# Patient Record
Sex: Female | Born: 1940 | Race: White | Hispanic: No | Marital: Married | Smoking: Never smoker
Health system: Southern US, Community
[De-identification: ages and names within clinical notes are randomized; demographics above are authoritative.]

## PROBLEM LIST (undated history)

## (undated) DIAGNOSIS — T8859XA Other complications of anesthesia, initial encounter: Secondary | ICD-10-CM

## (undated) DIAGNOSIS — M1990 Unspecified osteoarthritis, unspecified site: Secondary | ICD-10-CM

## (undated) DIAGNOSIS — E039 Hypothyroidism, unspecified: Secondary | ICD-10-CM

## (undated) DIAGNOSIS — T4145XA Adverse effect of unspecified anesthetic, initial encounter: Secondary | ICD-10-CM

## (undated) DIAGNOSIS — C801 Malignant (primary) neoplasm, unspecified: Secondary | ICD-10-CM

## (undated) DIAGNOSIS — R55 Syncope and collapse: Secondary | ICD-10-CM

## (undated) DIAGNOSIS — E079 Disorder of thyroid, unspecified: Secondary | ICD-10-CM

## (undated) HISTORY — PX: COSMETIC SURGERY: SHX468

## (undated) HISTORY — PX: KNEE SURGERY: SHX244

## (undated) HISTORY — PX: CATARACT EXTRACTION: SUR2

## (undated) HISTORY — PX: ABDOMINAL HYSTERECTOMY: SHX81

## (undated) HISTORY — DX: Unspecified osteoarthritis, unspecified site: M19.90

## (undated) HISTORY — PX: BRACHIOPLASTY: SUR162

## (undated) HISTORY — PX: BREAST SURGERY: SHX581

## (undated) HISTORY — PX: LENS MATERIAL REMOVAL: SHX368

## (undated) HISTORY — PX: EYE SURGERY: SHX253

---

## 2011-08-22 ENCOUNTER — Emergency Department
Admission: EM | Admit: 2011-08-22 | Discharge: 2011-08-22 | Disposition: A | Discharge status: Home / Self Care | Payer: Medicare Other | Source: Home / Self Care | Attending: Emergency Medicine | Admitting: Emergency Medicine

## 2011-08-22 ENCOUNTER — Encounter: Payer: Self-pay | Admitting: *Deleted

## 2011-08-22 DIAGNOSIS — L02219 Cutaneous abscess of trunk, unspecified: Secondary | ICD-10-CM

## 2011-08-22 DIAGNOSIS — L02211 Cutaneous abscess of abdominal wall: Secondary | ICD-10-CM

## 2011-08-22 MED ORDER — DOXYCYCLINE HYCLATE 100 MG PO CAPS: 100.0000 mg | ORAL_CAPSULE | Freq: Two times a day (BID) | ORAL | Status: DC

## 2011-08-22 MED ORDER — SULFAMETHOXAZOLE-TRIMETHOPRIM 800-160 MG PO TABS: 1.0000 | ORAL_TABLET | Freq: Two times a day (BID) | ORAL | Status: AC

## 2011-08-22 NOTE — ED Provider Notes (Signed)
History     CSN: [id number]  Arrival date & time 08/22/11  1035   First MD Initiated Contact with Patient 08/22/11 1126      Chief Complaint  Patient presents with  . Cyst    lower abdomen   HPI: @ATITLE @  @PATIENTLASTNAME @ presents for evaluation of a cutaneous abscess. The lesion is located in the lower mid abdominal wall.. Onset was 1 day ago. Symptoms have gradually worsened. Abscess has associated symptoms of pain. She  does have previous history of cutaneous abscesses. There is not a previous history of MRSA.  She does not have a history of diabetes.  ROS:  Systemic:  No fevers, chills or sweats. Skin:  No rash or other skin lesions.  PE:  General:  Alert, active, in no distress. Skin:  There was a 2 cm, red, hot, tender, fluctulent mass on the lower mid abdomen, as depicted.  No other skin rash or skin lesions.   IMP:  Abscess of the abdominal wall.   Procedure: Incision and Drainage The procedure, risks and complications have been discussed in detail (including, but not limited to airway compromise, infection, bleeding) with the patient, and the patient has given verbal consent to the procedure.  The skin was prepped with Betadine and alcohol. After adequate local anesthesia with 3 cc of 1% lidocaine with epinephrine), I&D with a #11 blade was performed on the abscess as described in physical exam section. Purulent drainage: present.  Blunt dissection was used to break up loculations.     Wound culture was sent. A sterile pressure dressing was applied. The patient tolerated the procedure well. She was given aftercare instructions and started on antibiotics as listed below.    The history is provided by the patient and the spouse.    History reviewed. No pertinent past medical history.  Past Surgical History  Procedure Date  . Lens material removal     Family History  Problem Relation Age of Onset  . Diabetes Mother   . Heart failure Father     History  Substance  Use Topics  . Smoking status: Never Smoker   . Smokeless tobacco: Not on file  . Alcohol Use: Yes    OB History    Grav Para Term Preterm Abortions TAB SAB Ect Mult Living                  Review of Systems  Allergies  Review of patient's allergies indicates no known allergies.  Home Medications   Current Outpatient Rx  Name Route Sig Dispense Refill  . CONJ ESTROG-MEDROXYPROGEST ACE 0.3-1.5 MG PO TABS Oral Take 1 tablet by mouth daily.      . SULFAMETHOXAZOLE-TRIMETHOPRIM 800-160 MG PO TABS Oral Take 1 tablet by mouth 2 (two) times daily. 20 tablet 0    BP 136/72  Pulse 65  Temp(Src) 97.9 F (36.6 C) (Oral)  Resp 16  Ht 5\' 3"  (1.6 m)  Wt 151 lb (68.493 kg)  BMI 26.75 kg/m2  SpO2 98%  Physical Exam  Nursing note and vitals reviewed. Constitutional: She is oriented to person, place, and time. She appears well-developed and well-nourished. No distress.  HENT:  Head: Normocephalic and atraumatic.  Eyes: Conjunctivae and EOM are normal. Pupils are equal, round, and reactive to light. No scleral icterus.  Neck: Normal range of motion.  Cardiovascular: Normal rate.   Pulmonary/Chest: Effort normal.  Abdominal: She exhibits no distension.    Musculoskeletal: Normal range of motion.  Neurological: She is  alert and oriented to person, place, and time.  Skin: Skin is warm.  Psychiatric: She has a normal mood and affect.    ED Course  Procedures (including critical care time)   Labs Reviewed  WOUND CULTURE   Diagnosis: Abscess lower abdominal wall  MDM  See procedure note for details. See detailed Instructions in AVS, which were given to patient. Verbal instructions also given. Questions invited and answered.  Initially, I prescribed doxycycline, but her pharmacy did not have this. Patient requests a different antibiotic, although I explained doxycycline was my first choice. To cover staph and some MRSA, Septra DS prescribed.  Options discussed. Risks,  benefits, alternatives discussed. Patient voiced understanding and agreement.         Lonell Face, MD 08/22/11 [phone]

## 2011-08-22 NOTE — ED Notes (Signed)
Patient developed a cyst on her lower abdomen yesterday. Hx of cyst in same area.

## 2011-08-25 LAB — WOUND CULTURE

## 2011-08-26 ENCOUNTER — Telehealth: Payer: Self-pay | Admitting: Family Medicine

## 2013-05-22 ENCOUNTER — Emergency Department
Admission: EM | Admit: 2013-05-22 | Discharge: 2013-05-22 | Disposition: A | Discharge status: Home / Self Care | Payer: Medicare Other | Source: Home / Self Care | Attending: Family Medicine | Admitting: Family Medicine

## 2013-05-22 ENCOUNTER — Encounter: Payer: Self-pay | Admitting: *Deleted

## 2013-05-22 DIAGNOSIS — R358 Other polyuria: Secondary | ICD-10-CM

## 2013-05-22 HISTORY — DX: Disorder of thyroid, unspecified: E07.9

## 2013-05-22 LAB — POCT URINALYSIS DIP (MANUAL ENTRY)
Glucose, UA: NEGATIVE
Nitrite, UA: NEGATIVE
Urobilinogen, UA: 0.2 (ref 0–1)
pH, UA: 7 (ref 5–8)

## 2013-05-22 MED ORDER — CEPHALEXIN 500 MG PO CAPS: 500.0000 mg | ORAL_CAPSULE | Freq: Three times a day (TID) | ORAL | Status: DC

## 2013-05-22 NOTE — ED Provider Notes (Signed)
CSN: [id number]     Arrival date & time 05/22/13  1708 History   First MD Initiated Contact with Patient 05/22/13 1711     Chief Complaint  Patient presents with  . Urinary Tract Infection    HPI  DYSURIA Onset:  2 days  Description: dysuria, increased urinary frequency  Modifying factors: Has had increased sexual activity with husband s/p operative repair of ED over past 3 months. Has had monthly UTIs.   Symptoms Urgency:  yes Frequency: yes  Hesitancy:  yes Hematuria:  no Flank Pain:  no Fever: no Nausea/Vomiting:  no Missed LMP: no STD exposure: no Discharge: no Irritants: no Rash: no  Red Flags   More than 3 UTI's last 12 months:  This will be 3rd  PMH of  Diabetes or Immunosuppression:  no Renal Disease/Calculi: no Urinary Tract Abnormality:  no Instrumentation or Trauma: no    Past Medical History  Diagnosis Date  . Thyroid disease    Past Surgical History  Procedure Laterality Date  . Lens material removal    . Knee surgery Right    Family History  Problem Relation Age of Onset  . Diabetes Mother   . Heart failure Father    History  Substance Use Topics  . Smoking status: Never Smoker   . Smokeless tobacco: Never Used  . Alcohol Use: Yes   OB History   Grav Para Term Preterm Abortions TAB SAB Ect Mult Living                 Review of Systems  All other systems reviewed and are negative.    Allergies  Review of patient's allergies indicates no known allergies.  Home Medications   Current Outpatient Rx  Name  Route  Sig  Dispense  Refill  . levothyroxine (SYNTHROID, LEVOTHROID) 100 MCG tablet   Oral   Take 100 mcg by mouth daily before breakfast.         . cephALEXin (KEFLEX) 500 MG capsule   Oral   Take 1 capsule (500 mg total) by mouth 3 (three) times daily.   21 capsule   0   . estrogen, conjugated,-medroxyprogesterone (PREMPRO) 0.3-1.5 MG per tablet   Oral   Take 1 tablet by mouth daily.            BP 128/75   Pulse 73  Temp(Src) 98.1 F (36.7 C) (Oral)  Resp 12  Ht 5\' 3"  (1.6 m)  Wt 129 lb 3.2 oz (58.605 kg)  BMI 22.89 kg/m2  SpO2 99% Physical Exam  Constitutional: She appears well-developed and well-nourished.  HENT:  Head: Normocephalic and atraumatic.  Eyes: Conjunctivae are normal. Pupils are equal, round, and reactive to light.  Neck: Normal range of motion. Neck supple.  Cardiovascular: Normal rate and regular rhythm.   Pulmonary/Chest: Effort normal and breath sounds normal.  Abdominal: Soft. Bowel sounds are normal.  No flank pain  No suprapubic tenderness   Musculoskeletal: Normal range of motion.  Neurological: She is alert.  Skin: Skin is warm.    ED Course  Procedures (including critical care time) Labs Review Labs Reviewed  URINE CULTURE  POCT URINALYSIS DIP (MANUAL ENTRY)   Imaging Review No results found.  MDM   1. Polyuria    Will treat with keflex Urine culture Discussed infectious and GU red flags.  Follow up with urology if sxs recur.      The patient and/or caregiver has been counseled thoroughly with regard to treatment plan and/or  medications prescribed including dosage, schedule, interactions, rationale for use, and possible side effects and they verbalize understanding. Diagnoses and expected course of recovery discussed and will return if not improved as expected or if the condition worsens. Patient and/or caregiver verbalized understanding.         Doree Albee, MD 05/22/13 [phone]

## 2013-05-22 NOTE — ED Notes (Signed)
Carol Lambert c/o polyuria and dysuria x 3 days. Denies fever, chills flank pain. No otc meds taken.

## 2013-05-25 ENCOUNTER — Telehealth: Payer: Self-pay | Admitting: Emergency Medicine

## 2015-04-21 DIAGNOSIS — I10 Essential (primary) hypertension: Secondary | ICD-10-CM | POA: Insufficient documentation

## 2017-06-09 ENCOUNTER — Encounter: Payer: Self-pay | Admitting: Gynecologic Oncology

## 2017-06-09 ENCOUNTER — Ambulatory Visit: Payer: Medicare Other | Attending: Gynecologic Oncology | Admitting: Gynecologic Oncology

## 2017-06-09 ENCOUNTER — Ambulatory Visit (HOSPITAL_BASED_OUTPATIENT_CLINIC_OR_DEPARTMENT_OTHER): Payer: Medicare Other

## 2017-06-09 VITALS — BP 127/62 | HR 52 | Temp 97.7°F | Resp 18 | Ht 62.5 in | Wt 135.6 lb

## 2017-06-09 DIAGNOSIS — C541 Malignant neoplasm of endometrium: Secondary | ICD-10-CM | POA: Diagnosis present

## 2017-06-09 DIAGNOSIS — Z803 Family history of malignant neoplasm of breast: Secondary | ICD-10-CM | POA: Diagnosis not present

## 2017-06-09 DIAGNOSIS — Z7982 Long term (current) use of aspirin: Secondary | ICD-10-CM | POA: Diagnosis not present

## 2017-06-09 DIAGNOSIS — H9190 Unspecified hearing loss, unspecified ear: Secondary | ICD-10-CM | POA: Insufficient documentation

## 2017-06-09 DIAGNOSIS — Z79899 Other long term (current) drug therapy: Secondary | ICD-10-CM | POA: Insufficient documentation

## 2017-06-09 DIAGNOSIS — E079 Disorder of thyroid, unspecified: Secondary | ICD-10-CM | POA: Diagnosis not present

## 2017-06-09 DIAGNOSIS — H918X2 Other specified hearing loss, left ear: Secondary | ICD-10-CM

## 2017-06-09 DIAGNOSIS — E039 Hypothyroidism, unspecified: Secondary | ICD-10-CM | POA: Diagnosis not present

## 2017-06-09 LAB — COMPREHENSIVE METABOLIC PANEL
ALBUMIN: 4.1 g/dL (ref 3.5–5.0)
ALK PHOS: 71 U/L (ref 40–150)
ALT: 17 U/L (ref 0–55)
ANION GAP: 9 meq/L (ref 3–11)
AST: 16 U/L (ref 5–34)
BILIRUBIN TOTAL: 1.09 mg/dL (ref 0.20–1.20)
BUN: 22.5 mg/dL (ref 7.0–26.0)
CALCIUM: 10.4 mg/dL (ref 8.4–10.4)
CO2: 30 mEq/L — ABNORMAL HIGH (ref 22–29)
CREATININE: 0.8 mg/dL (ref 0.6–1.1)
Chloride: 103 mEq/L (ref 98–109)
Glucose: 102 mg/dl (ref 70–140)
Potassium: 3.8 mEq/L (ref 3.5–5.1)
Sodium: 142 mEq/L (ref 136–145)
TOTAL PROTEIN: 7.3 g/dL (ref 6.4–8.3)

## 2017-06-09 NOTE — Progress Notes (Signed)
Consult Note: Gyn-Onc  Consult was requested by Dr. Durene Fruits for the evaluation of [name] 76 y.o. female  CC:  Chief Complaint  Patient presents with  . Endometrial cancer [facility])    Assessment/Plan:  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  A detailed discussion was held with the patient and her family with regard to to her endometrial cancer diagnosis. We discussed the standard management options for uterine cancer which includes surgery followed possibly by adjuvant therapy depending on the results of surgery. The options for surgical management include a hysterectomy and removal of the tubes and ovaries possibly with removal of pelvic and para-aortic lymph nodes.If feasible, a minimally invasive approach including a robotic hysterectomy or laparoscopic hysterectomy have benefits including shorter hospital stay, recovery time and better wound healing than with open surgery. The patient has been counseled about these surgical options and the risks of surgery in general including infection, bleeding, damage to surrounding structures (including bowel, bladder, ureters, nerves or vessels), and the postoperative risks of PE/ DVT, and lymphedema. I extensively reviewed the additional risks of robotic hysterectomy including possible need for conversion to open laparotomy.  I discussed positioning during surgery of trendelenberg and risks of minor facial swelling and care we take in preoperative positioning.  After counseling and consideration of her options, she desires to proceed with robotic assisted total hysterectomy with bilateral sapingo-oophorectomy and SLN biopsy.  Due to her nulliparous narrow vagina, she may have some abrasions to the vagina at the time of surgery and I counseled her about this.  She will be seen by anesthesia for preoperative clearance and discussion of postoperative pain management.  She was given the opportunity to ask  questions, which were answered to her satisfaction, and she is agreement with the above mentioned plan of care.  I discussed that due to her high grade cancer we will order a preoperative CT scan with IV contrast.  I discussed that due to her high grade cancer there is a high probability for adjuvant therapy being recommended (vaginal brachytherapy, and possibly chemotherapy).  She takes 81mg  ASA daily, and I have counseled her to stop this prior to surgery.  HPI: Carol Lambert is a 76 year old P0 who is seen in consultation at the request of [name] with high grade endometrial cancer.  The patient has a history of postmenopausal bleeding since September, 2018. She reported this to her OBGYN who performed a TVUS on 05/03/17 which showed a 7.9cm uterus (retroverted) with an 14mm thickened endometrium.  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   She stopped bleeding after the procedure.  She is very healthy with no major medical ailments besides hypothyroidism. She has a 25 year history of postmenopausal prempro usage (stopped this in 2016). She has had no prior abdominal surgeries. Her BMI is 24mg /m2. She takes 81mg  ASA daily. She has a maternal aunt with a history of breast cancer at age 68.  Current Meds:  Outpatient Encounter Prescriptions as of 06/09/2017  Medication Sig  . escitalopram (LEXAPRO) 10 MG tablet Take 10 mg by mouth daily.  Marland Kitchen levothyroxine (SYNTHROID, LEVOTHROID) 100 MCG tablet Take 100 mcg by mouth daily before breakfast.  . [DISCONTINUED] cephALEXin (KEFLEX) 500 MG capsule Take 1 capsule (500 mg total) by mouth 3 (three) times daily.  . [DISCONTINUED] estrogen, conjugated,-medroxyprogesterone (  PREMPRO) 0.3-1.5 MG per tablet Take 1 tablet by mouth daily.     No facility-administered encounter medications on file as of  06/09/2017.     Allergy: No Known Allergies  Social Hx:   Social History   Social History  . Marital status: Married    Spouse name: N/A  . Number of children: N/A  . Years of education: N/A   Occupational History  . Not on file.   Social History Main Topics  . Smoking status: Never Smoker  . Smokeless tobacco: Never Used  . Alcohol use Yes  . Drug use: No  . Sexual activity: Not on file   Other Topics Concern  . Not on file   Social History Narrative  . No narrative on file    Past Surgical Hx:  Past Surgical History:  Procedure Laterality Date  . KNEE SURGERY Right   . LENS MATERIAL REMOVAL      Past Medical Hx:  Past Medical History:  Diagnosis Date  . Thyroid disease     Past Gynecological History:  G0 No LMP recorded. Patient is postmenopausal.  Family Hx:  Family History  Problem Relation Age of Onset  . Diabetes Mother   . Heart failure Father   . Breast cancer Maternal Aunt   . Cancer Maternal Aunt     Review of Systems:  Constitutional  Feels well,    ENT Normal appearing ears and nares bilaterally Skin/Breast  No rash, sores, jaundice, itching, dryness Cardiovascular  No chest pain, shortness of breath, or edema  Pulmonary  No cough or wheeze.  Gastro Intestinal  No nausea, vomitting, or diarrhoea. No bright red blood per rectum, no abdominal pain, change in bowel movement, or constipation.  Genito Urinary  No frequency, urgency, dysuria, + postmenopausal bleeding Musculo Skeletal  No myalgia, arthralgia, joint swelling or pain  Neurologic  No weakness, numbness, change in gait,  Psychology  No depression, anxiety, insomnia.   Vitals:  Blood pressure 127/62, pulse (!) 52, temperature 97.7 F (36.5 C), temperature source Oral, resp. rate 18, height 5' 2.5" (1.588 m), weight 135 lb 9.6 oz (61.5 kg), SpO2 100 %.  Physical Exam: WD in NAD Neck  Supple NROM, without any enlargements.  Lymph Node Survey No cervical  supraclavicular or inguinal adenopathy Cardiovascular  Pulse normal rate, regularity and rhythm. S1 and S2 normal.  Lungs  Clear to auscultation bilateraly, without wheezes/crackles/rhonchi. Good air movement.  Skin  No rash/lesions/breakdown  Psychiatry  Alert and oriented to person, place, and time  Abdomen  Normoactive bowel sounds, abdomen soft, non-tender and thin without evidence of hernia.  Back No CVA tenderness Genito Urinary  Vulva/vagina: Normal external female genitalia.   No lesions. No discharge or bleeding.  Bladder/urethra:  No lesions or masses, well supported bladder  Vagina: normal, narrow  Cervix: Normal appearing, no lesions.  Uterus: retroverted, Small, mobile, no parametrial involvement or nodularity.  Adnexa: no palpable masses. Rectal  deferred Extremities  No bilateral cyanosis, clubbing or edema.   Donaciano Eva, MD  06/09/2017, 9:29 AM

## 2017-06-09 NOTE — Patient Instructions (Signed)
Preparing for your Surgery  Plan for surgery on July 18, 2017 with Dr. Everitt Amber at [place] will be scheduled for a robotic assisted total hysterectomy, bilateral salpingo-oophorectomy, sentinel lymph node biopsy.  Pre-operative Testing -You will receive a phone call from presurgical testing at [facility] to arrange for a pre-operative testing appointment before your surgery.  This appointment normally occurs one to two weeks before your scheduled surgery.   -Bring your insurance card, copy of an advanced directive if applicable, medication list  -At that visit, you will be asked to sign a consent for a possible blood transfusion in case a transfusion becomes necessary during surgery.  The need for a blood transfusion is rare but having consent is a necessary part of your care.     -You should not be taking blood thinners or aspirin at least ten days prior to surgery unless instructed by your surgeon.  Day Before Surgery at [facility] will be asked to take in a light diet the day before surgery.  Avoid carbonated beverages.  You will be advised to have nothing to eat or drink after midnight the evening before.    Eat a light diet the day before surgery.  Examples including soups, broths, toast, yogurt, mashed potatoes.  Things to avoid include carbonated beverages (fizzy beverages), raw fruits and raw vegetables, or beans.   If your bowels are filled with gas, your surgeon will have difficulty visualizing your pelvic organs which increases your surgical risks.  Your role in recovery Your role is to become active as soon as directed by your doctor, while still giving yourself time to heal.  Rest when you feel tired. You will be asked to do the following in order to speed your recovery:  - Cough and breathe deeply. This helps toclear and expand your lungs and can prevent pneumonia. You may be given a spirometer to practice deep breathing. A staff  member will show you how to use the spirometer. - Do mild physical activity. Walking or moving your legs help your circulation and body functions return to normal. A staff member will help you when you try to walk and will provide you with simple exercises. Do not try to get up or walk alone the first time. - Actively manage your pain. Managing your pain lets you move in comfort. We will ask you to rate your pain on a scale of zero to 10. It is your responsibility to tell your doctor or nurse where and how much you hurt so your pain can be treated.  Special Considerations -If you are diabetic, you may be placed on insulin after surgery to have closer control over your blood sugars to promote healing and recovery.  This does not mean that you will be discharged on insulin.  If applicable, your oral antidiabetics will be resumed when you are tolerating a solid diet.  -Your final pathology results from surgery should be available by the Friday after surgery and the results will be relayed to you when available.  -Dr. Lahoma Crocker is the Surgeon that assists your GYN Oncologist with surgery.  The next day after your surgery you will either see your GYN Oncologist or Dr. Lahoma Crocker.   Blood Transfusion Information WHAT IS A BLOOD TRANSFUSION? A transfusion is the replacement of blood or some of its parts. Blood is made up of multiple cells which provide different functions.  Red blood cells carry oxygen and are used for blood  loss replacement.  White blood cells fight against infection.  Platelets control bleeding.  Plasma helps clot blood.  Other blood products are available for specialized needs, such as hemophilia or other clotting disorders. BEFORE THE TRANSFUSION  Who gives blood for transfusions?   You may be able to donate blood to be used at a later date on yourself (autologous donation).  Relatives can be asked to donate blood. This is generally not any safer than if  you have received blood from a stranger. The same precautions are taken to ensure safety when a relative's blood is donated.  Healthy volunteers who are fully evaluated to make sure their blood is safe. This is blood bank blood. Transfusion therapy is the safest it has ever been in the practice of medicine. Before blood is taken from a donor, a complete history is taken to make sure that person has no history of diseases nor engages in risky social behavior (examples are intravenous drug use or sexual activity with multiple partners). The donor's travel history is screened to minimize risk of transmitting infections, such as malaria. The donated blood is tested for signs of infectious diseases, such as HIV and hepatitis. The blood is then tested to be sure it is compatible with you in order to minimize the chance of a transfusion reaction. If you or a relative donates blood, this is often done in anticipation of surgery and is not appropriate for emergency situations. It takes many days to process the donated blood. RISKS AND COMPLICATIONS Although transfusion therapy is very safe and saves many lives, the main dangers of transfusion include:   Getting an infectious disease.  Developing a transfusion reaction. This is an allergic reaction to something in the blood you were given. Every precaution is taken to prevent this. The decision to have a blood transfusion has been considered carefully by your caregiver before blood is given. Blood is not given unless the benefits outweigh the risks.

## 2017-06-13 ENCOUNTER — Telehealth: Payer: Self-pay | Admitting: *Deleted

## 2017-06-13 NOTE — Telephone Encounter (Signed)
Scheduled post op appt and called the patient with the appt.

## 2017-06-14 ENCOUNTER — Encounter (HOSPITAL_COMMUNITY): Payer: Self-pay

## 2017-06-14 ENCOUNTER — Ambulatory Visit (HOSPITAL_COMMUNITY)
Admission: RE | Admit: 2017-06-14 | Discharge: 2017-06-14 | Disposition: A | Discharge status: Home / Self Care | Payer: Medicare Other | Source: Ambulatory Visit | Attending: Gynecologic Oncology | Admitting: Gynecologic Oncology

## 2017-06-14 DIAGNOSIS — I700 Atherosclerosis of aorta: Secondary | ICD-10-CM | POA: Diagnosis not present

## 2017-06-14 DIAGNOSIS — K8020 Calculus of gallbladder without cholecystitis without obstruction: Secondary | ICD-10-CM | POA: Insufficient documentation

## 2017-06-14 DIAGNOSIS — C541 Malignant neoplasm of endometrium: Secondary | ICD-10-CM | POA: Diagnosis not present

## 2017-06-14 MED ORDER — IOPAMIDOL (ISOVUE-300) INJECTION 61%
INTRAVENOUS | Status: AC
  Administered 2017-06-14: 100 mL via INTRAVENOUS
  Filled 2017-06-14: qty 100

## 2017-06-14 MED ORDER — IOPAMIDOL (ISOVUE-300) INJECTION 61%
100.0000 mL | Freq: Once | INTRAVENOUS | Status: AC | PRN
  Administered 2017-06-14: 100 mL via INTRAVENOUS

## 2017-06-15 ENCOUNTER — Telehealth: Payer: Self-pay | Admitting: Gynecologic Oncology

## 2017-06-15 NOTE — Telephone Encounter (Signed)
Patient informed of CT scan results.  No concerns voiced.  Advised to call for any needs. 

## 2017-06-20 ENCOUNTER — Telehealth: Payer: Self-pay

## 2017-06-20 NOTE — Telephone Encounter (Signed)
Carol Lambert called to see if she could use Sudafed for nasal drainage as she is scheduled for surgery on 07-18-17. Told her that she can used it up until 07-14-17 per Melissa Cross,NP.  Pt verbalized understanding.

## 2017-06-29 ENCOUNTER — Telehealth: Payer: Self-pay | Admitting: Gynecologic Oncology

## 2017-06-29 NOTE — Telephone Encounter (Signed)
Patient called stating she would like to stay overnight after surgery instead of being discharged same day.  Spoke with Surgery scheduling and left a message for Amedeo Gory, prior auth specialist.

## 2017-06-30 ENCOUNTER — Telehealth: Payer: Self-pay | Admitting: Gynecologic Oncology

## 2017-06-30 NOTE — Telephone Encounter (Signed)
Returned call to patient asking her to please call the office

## 2017-07-03 ENCOUNTER — Telehealth: Payer: Self-pay | Admitting: Gynecologic Oncology

## 2017-07-03 NOTE — Telephone Encounter (Signed)
Returned call to patient.  She was asking about her CT scan and about random intermittent twinges she has.  All questions answered.  Advised to call for any needs.

## 2017-07-05 ENCOUNTER — Telehealth: Payer: Self-pay | Admitting: Gynecologic Oncology

## 2017-07-05 NOTE — Telephone Encounter (Signed)
Called to inform patient that there had been a cancellation for next week with Dr. Denman George.  She would like to move her surgery up.  Left message for Darlena with prior auth, spoke with pre-surg as well.  No concerns voiced.

## 2017-07-07 NOTE — Patient Instructions (Addendum)
Carol Lambert  07/07/2017   Your procedure is scheduled on: 07-11-17  Report to [facility] Main  Entrance Take [place]  elevators to 3rd floor to  [place] at 1130AM.   Call this number if you have problems the morning of surgery [phone]    Remember: ONLY 1 PERSON MAY GO WITH YOU TO SHORT STAY TO GET  READY MORNING OF [place].     Eat a light diet the day before surgery.  Examples including soups, broths, toast, yogurt, mashed potatoes.  Things to avoid include carbonated beverages (fizzy beverages), raw fruits and raw vegetables, or beans. If your bowels are filled with gas, your surgeon will have difficulty visualizing your pelvic organs which increases your surgical risks   Do not eat food or drink liquids :After Midnight.     Take these medicines the morning of surgery with A SIP OF WATER: levothyroxine(synthroid), eye drops                                 You may not have any metal on your body including hair pins and              piercings  Do not wear jewelry, make-up, lotions, powders or perfumes, deodorant             Do not wear nail polish.  Do not shave  48 hours prior to surgery.     Do not bring valuables to the hospital. [place].  Contacts, dentures or bridgework may not be worn into surgery.  Leave suitcase in the car. After surgery it may be brought to your room.                 Please read over the following fact sheets you were given: _____________________________________________________________________          .[place] - Preparing for Surgery Before surgery, you can play an important role.  Because skin is not sterile, your skin needs to be as free of germs as possible.  You can reduce the number of germs on your skin by washing with CHG (chlorahexidine gluconate) soap before surgery.  CHG is an antiseptic cleaner which kills germs and bonds with the skin to  continue killing germs even after washing. Please DO NOT use if you have an allergy to CHG or antibacterial soaps.  If your skin becomes reddened/irritated stop using the CHG and inform your nurse when you arrive at Short Stay. Do not shave (including legs and underarms) for at least 48 hours prior to the first CHG shower.  You may shave your face/neck. Please follow these instructions carefully:  1.  Shower with CHG Soap the night before surgery and the  morning of Surgery.  2.  If you choose to wash your hair, wash your hair first as usual with your  normal  shampoo.  3.  After you shampoo, rinse your hair and body thoroughly to remove the  shampoo.                           4.  Use CHG as you would any other liquid soap.  You can apply chg directly  to the  skin and wash                       Gently with a scrungie or clean washcloth.  5.  Apply the CHG Soap to your body ONLY FROM THE NECK DOWN.   Do not use on face/ open                           Wound or open sores. Avoid contact with eyes, ears mouth and genitals (private parts).                       Wash face,  Genitals (private parts) with your normal soap.             6.  Wash thoroughly, paying special attention to the area where your surgery  will be performed.  7.  Thoroughly rinse your body with warm water from the neck down.  8.  DO NOT shower/wash with your normal soap after using and rinsing off  the CHG Soap.                9.  Pat yourself dry with a clean towel.            10.  Wear clean pajamas.            11.  Place clean sheets on your bed the night of your first shower and do not  sleep with pets. Day of Surgery : Do not apply any lotions/deodorants the morning of surgery.  Please wear clean clothes to the hospital/surgery center.  FAILURE TO FOLLOW THESE INSTRUCTIONS MAY RESULT IN THE CANCELLATION OF YOUR SURGERY PATIENT SIGNATURE_________________________________  NURSE  SIGNATURE__________________________________  ________________________________________________________________________   Carol Lambert  An incentive spirometer is a tool that can help keep your lungs clear and active. This tool measures how well you are filling your lungs with each breath. Taking long deep breaths may help reverse or decrease the chance of developing breathing (pulmonary) problems (especially infection) following:  A long period of time when you are unable to move or be active. BEFORE THE PROCEDURE   If the spirometer includes an indicator to show your best effort, your nurse or respiratory therapist will set it to a desired goal.  If possible, sit up straight or lean slightly forward. Try not to slouch.  Hold the incentive spirometer in an upright position. INSTRUCTIONS FOR USE  1. Sit on the edge of your bed if possible, or sit up as far as you can in bed or on a chair. 2. Hold the incentive spirometer in an upright position. 3. Breathe out normally. 4. Place the mouthpiece in your mouth and seal your lips tightly around it. 5. Breathe in slowly and as deeply as possible, raising the piston or the ball toward the top of the column. 6. Hold your breath for 3-5 seconds or for as long as possible. Allow the piston or ball to fall to the bottom of the column. 7. Remove the mouthpiece from your mouth and breathe out normally. 8. Rest for a few seconds and repeat Steps 1 through 7 at least 10 times every 1-2 hours when you are awake. Take your time and take a few normal breaths between deep breaths. 9. The spirometer may include an indicator to show your best effort. Use the indicator as a goal to work toward during each repetition. 10. After each set of  10 deep breaths, practice coughing to be sure your lungs are clear. If you have an incision (the cut made at the time of surgery), support your incision when coughing by placing a pillow or rolled up towels firmly  against it. Once you are able to get out of bed, walk around indoors and cough well. You may stop using the incentive spirometer when instructed by your caregiver.  RISKS AND COMPLICATIONS  Take your time so you do not get dizzy or light-headed.  If you are in pain, you may need to take or ask for pain medication before doing incentive spirometry. It is harder to take a deep breath if you are having pain. AFTER USE  Rest and breathe slowly and easily.  It can be helpful to keep track of a log of your progress. Your caregiver can provide you with a simple table to help with this. If you are using the spirometer at home, follow these instructions: [place] IF:   You are having difficultly using the spirometer.  You have trouble using the spirometer as often as instructed.  Your pain medication is not giving enough relief while using the spirometer.  You develop fever of 100.5 F (38.1 C) or higher. SEEK IMMEDIATE MEDICAL CARE IF:   You cough up bloody sputum that had not been present before.  You develop fever of 102 F (38.9 C) or greater.  You develop worsening pain at or near the incision site. MAKE SURE YOU:   Understand these instructions.  Will watch your condition.  Will get help right away if you are not doing well or get worse. Document Released: 12/26/2006 Document Revised: 11/07/2011 Document Reviewed: 02/26/2007 ExitCare Patient Information 2014 ExitCare, [place].   ________________________________________________________________________  WHAT IS A BLOOD TRANSFUSION? Blood Transfusion Information  A transfusion is the replacement of blood or some of its parts. Blood is made up of multiple cells which provide different functions.  Red blood cells carry oxygen and are used for blood loss replacement.  White blood cells fight against infection.  Platelets control bleeding.  Plasma helps clot blood.  Other blood products are available for  specialized needs, such as hemophilia or other clotting disorders. BEFORE THE TRANSFUSION  Who gives blood for transfusions?   Healthy volunteers who are fully evaluated to make sure their blood is safe. This is blood bank blood. Transfusion therapy is the safest it has ever been in the practice of medicine. Before blood is taken from a donor, a complete history is taken to make sure that person has no history of diseases nor engages in risky social behavior (examples are intravenous drug use or sexual activity with multiple partners). The donor's travel history is screened to minimize risk of transmitting infections, such as malaria. The donated blood is tested for signs of infectious diseases, such as HIV and hepatitis. The blood is then tested to be sure it is compatible with you in order to minimize the chance of a transfusion reaction. If you or a relative donates blood, this is often done in anticipation of surgery and is not appropriate for emergency situations. It takes many days to process the donated blood. RISKS AND COMPLICATIONS Although transfusion therapy is very safe and saves many lives, the main dangers of transfusion include:   Getting an infectious disease.  Developing a transfusion reaction. This is an allergic reaction to something in the blood you were given. Every precaution is taken to prevent this. The decision to have a blood  transfusion has been considered carefully by your caregiver before blood is given. Blood is not given unless the benefits outweigh the risks. AFTER THE TRANSFUSION  Right after receiving a blood transfusion, you will usually feel much better and more energetic. This is especially true if your red blood cells have gotten low (anemic). The transfusion raises the level of the red blood cells which carry oxygen, and this usually causes an energy increase.  The nurse administering the transfusion will monitor you carefully for complications. HOME CARE  INSTRUCTIONS  No special instructions are needed after a transfusion. You may find your energy is better. Speak with your caregiver about any limitations on activity for underlying diseases you may have. SEEK MEDICAL CARE IF:   Your condition is not improving after your transfusion.  You develop redness or irritation at the intravenous (IV) site. SEEK IMMEDIATE MEDICAL CARE IF:  Any of the following symptoms occur over the next 12 hours:  Shaking chills.  You have a temperature by mouth above 102 F (38.9 C), not controlled by medicine.  Chest, back, or muscle pain.  People around you feel you are not acting correctly or are confused.  Shortness of breath or difficulty breathing.  Dizziness and fainting.  You get a rash or develop hives.  You have a decrease in urine output.  Your urine turns a dark color or changes to pink, red, or brown. Any of the following symptoms occur over the next 10 days:  You have a temperature by mouth above 102 F (38.9 C), not controlled by medicine.  Shortness of breath.  Weakness after normal activity.  The white part of the eye turns yellow (jaundice).  You have a decrease in the amount of urine or are urinating less often.  Your urine turns a dark color or changes to pink, red, or brown. Document Released: 08/12/2000 Document Revised: 11/07/2011 Document Reviewed: 03/31/2008 [facility] Patient Information 2014 ExitCare, [place].  _______________________________________________________________________   Do not eat foods after midnight. You may have clear liquids from midnight until 8am. Nothing by mouth after 8am!!   CLEAR LIQUID DIET   Foods Allowed                                                                     Foods Excluded  Coffee and tea, regular and decaf                             liquids that you cannot  Plain Jell-O in any flavor                                             see through such as: Fruit ices (not with  fruit pulp)                                     milk, soups, orange juice  Iced Popsicles  All solid food Carbonated beverages, regular and diet                                    Cranberry, grape and apple juices Sports drinks like Gatorade Lightly seasoned clear broth or consume(fat free) Sugar, honey syrup  Sample Menu Breakfast                                Lunch                                     Supper Cranberry juice                    Beef broth                            Chicken broth Jell-O                                     Grape juice                           Apple juice Coffee or tea                        Jell-O                                      Popsicle                                                Coffee or tea                        Coffee or tea  _____________________________________________________________________

## 2017-07-10 ENCOUNTER — Encounter (HOSPITAL_COMMUNITY)
Admission: RE | Admit: 2017-07-10 | Discharge: 2017-07-10 | Disposition: A | Discharge status: Home / Self Care | Payer: Medicare Other | Source: Ambulatory Visit | Attending: Gynecologic Oncology | Admitting: Gynecologic Oncology

## 2017-07-10 ENCOUNTER — Encounter (HOSPITAL_COMMUNITY): Payer: Self-pay

## 2017-07-10 ENCOUNTER — Encounter (INDEPENDENT_AMBULATORY_CARE_PROVIDER_SITE_OTHER): Payer: Self-pay

## 2017-07-10 ENCOUNTER — Other Ambulatory Visit: Payer: Self-pay

## 2017-07-10 DIAGNOSIS — N800 Endometriosis of uterus: Secondary | ICD-10-CM | POA: Diagnosis not present

## 2017-07-10 DIAGNOSIS — C541 Malignant neoplasm of endometrium: Secondary | ICD-10-CM | POA: Diagnosis present

## 2017-07-10 DIAGNOSIS — E039 Hypothyroidism, unspecified: Secondary | ICD-10-CM | POA: Diagnosis not present

## 2017-07-10 DIAGNOSIS — I10 Essential (primary) hypertension: Secondary | ICD-10-CM | POA: Diagnosis not present

## 2017-07-10 DIAGNOSIS — N840 Polyp of corpus uteri: Secondary | ICD-10-CM | POA: Diagnosis not present

## 2017-07-10 DIAGNOSIS — Z7982 Long term (current) use of aspirin: Secondary | ICD-10-CM | POA: Diagnosis not present

## 2017-07-10 DIAGNOSIS — Z79899 Other long term (current) drug therapy: Secondary | ICD-10-CM | POA: Diagnosis not present

## 2017-07-10 HISTORY — DX: Syncope and collapse: R55

## 2017-07-10 HISTORY — DX: Hypothyroidism, unspecified: E03.9

## 2017-07-10 HISTORY — DX: Other complications of anesthesia, initial encounter: T88.59XA

## 2017-07-10 HISTORY — DX: Malignant (primary) neoplasm, unspecified: C80.1

## 2017-07-10 HISTORY — DX: Adverse effect of unspecified anesthetic, initial encounter: T41.45XA

## 2017-07-10 LAB — CBC
HCT: 37.9 % (ref 36.0–46.0)
HEMOGLOBIN: 12.9 g/dL (ref 12.0–15.0)
MCH: 31.5 pg (ref 26.0–34.0)
MCHC: 34 g/dL (ref 30.0–36.0)
MCV: 92.4 fL (ref 78.0–100.0)
Platelets: 278 10*3/uL (ref 150–400)
RBC: 4.1 MIL/uL (ref 3.87–5.11)
RDW: 12.5 % (ref 11.5–15.5)
WBC: 7 10*3/uL (ref 4.0–10.5)

## 2017-07-10 LAB — URINALYSIS, ROUTINE W REFLEX MICROSCOPIC
BACTERIA UA: NONE SEEN
BILIRUBIN URINE: NEGATIVE
GLUCOSE, UA: NEGATIVE mg/dL
HGB URINE DIPSTICK: NEGATIVE
KETONES UR: NEGATIVE mg/dL
NITRITE: NEGATIVE
PROTEIN: NEGATIVE mg/dL
Specific Gravity, Urine: 1.013 (ref 1.005–1.030)
pH: 6 (ref 5.0–8.0)

## 2017-07-10 LAB — COMPREHENSIVE METABOLIC PANEL
ALK PHOS: 67 U/L (ref 38–126)
ALT: 26 U/L (ref 14–54)
AST: 23 U/L (ref 15–41)
Albumin: 4 g/dL (ref 3.5–5.0)
Anion gap: 7 (ref 5–15)
BUN: 21 mg/dL — AB (ref 6–20)
CALCIUM: 9.5 mg/dL (ref 8.9–10.3)
CO2: 30 mmol/L (ref 22–32)
CREATININE: 0.72 mg/dL (ref 0.44–1.00)
Chloride: 102 mmol/L (ref 101–111)
Glucose, Bld: 101 mg/dL — ABNORMAL HIGH (ref 65–99)
Potassium: 3.5 mmol/L (ref 3.5–5.1)
Sodium: 139 mmol/L (ref 135–145)
Total Bilirubin: 0.6 mg/dL (ref 0.3–1.2)
Total Protein: 6.7 g/dL (ref 6.5–8.1)

## 2017-07-10 LAB — ABO/RH: ABO/RH(D): A POS

## 2017-07-11 ENCOUNTER — Ambulatory Visit (HOSPITAL_COMMUNITY): Payer: Medicare Other | Admitting: Certified Registered Nurse Anesthetist

## 2017-07-11 ENCOUNTER — Encounter (HOSPITAL_COMMUNITY)
Admission: RE | Disposition: A | Discharge status: Home / Self Care | Payer: Self-pay | Source: Ambulatory Visit | Attending: Gynecologic Oncology

## 2017-07-11 ENCOUNTER — Ambulatory Visit (HOSPITAL_COMMUNITY)
Admission: RE | Admit: 2017-07-11 | Discharge: 2017-07-12 | Disposition: A | Discharge status: Home / Self Care | Payer: Medicare Other | Source: Ambulatory Visit | Attending: Gynecologic Oncology | Admitting: Gynecologic Oncology

## 2017-07-11 ENCOUNTER — Encounter (HOSPITAL_COMMUNITY): Payer: Self-pay | Admitting: Emergency Medicine

## 2017-07-11 DIAGNOSIS — I10 Essential (primary) hypertension: Secondary | ICD-10-CM | POA: Insufficient documentation

## 2017-07-11 DIAGNOSIS — C541 Malignant neoplasm of endometrium: Secondary | ICD-10-CM | POA: Diagnosis not present

## 2017-07-11 DIAGNOSIS — N840 Polyp of corpus uteri: Secondary | ICD-10-CM | POA: Diagnosis not present

## 2017-07-11 DIAGNOSIS — N800 Endometriosis of uterus: Secondary | ICD-10-CM | POA: Insufficient documentation

## 2017-07-11 DIAGNOSIS — Z7982 Long term (current) use of aspirin: Secondary | ICD-10-CM | POA: Insufficient documentation

## 2017-07-11 DIAGNOSIS — Z79899 Other long term (current) drug therapy: Secondary | ICD-10-CM | POA: Insufficient documentation

## 2017-07-11 DIAGNOSIS — E039 Hypothyroidism, unspecified: Secondary | ICD-10-CM | POA: Insufficient documentation

## 2017-07-11 HISTORY — PX: ROBOTIC ASSISTED TOTAL HYSTERECTOMY WITH BILATERAL SALPINGO OOPHERECTOMY: SHX6086

## 2017-07-11 LAB — TYPE AND SCREEN
ABO/RH(D): A POS
ANTIBODY SCREEN: NEGATIVE

## 2017-07-11 SURGERY — HYSTERECTOMY, TOTAL, ROBOT-ASSISTED, LAPAROSCOPIC, WITH BILATERAL SALPINGO-OOPHORECTOMY
Anesthesia: General | Laterality: Bilateral

## 2017-07-11 MED ORDER — ENOXAPARIN SODIUM 40 MG/0.4ML ~~LOC~~ SOLN
40.0000 mg | SUBCUTANEOUS | Status: DC
  Administered 2017-07-12: 40 mg via SUBCUTANEOUS
  Filled 2017-07-11: qty 0.4

## 2017-07-11 MED ORDER — HYDROMORPHONE HCL 1 MG/ML IJ SOLN: 0.2000 mg | INTRAMUSCULAR | Status: DC | PRN

## 2017-07-11 MED ORDER — FENTANYL CITRATE (PF) 100 MCG/2ML IJ SOLN
INTRAMUSCULAR | Status: DC | PRN
  Administered 2017-07-11 (×5): 50 ug via INTRAVENOUS

## 2017-07-11 MED ORDER — CEFAZOLIN SODIUM-DEXTROSE 2-4 GM/100ML-% IV SOLN
2.0000 g | INTRAVENOUS | Status: AC
  Administered 2017-07-11: 2 g via INTRAVENOUS
  Filled 2017-07-11: qty 100

## 2017-07-11 MED ORDER — LACTATED RINGERS IV SOLN
INTRAVENOUS | Status: DC
  Administered 2017-07-11 (×3): via INTRAVENOUS

## 2017-07-11 MED ORDER — ROCURONIUM BROMIDE 100 MG/10ML IV SOLN
INTRAVENOUS | Status: DC | PRN
Start: 2017-07-11 — End: 2017-07-11
  Administered 2017-07-11: 40 mg via INTRAVENOUS
  Administered 2017-07-11 (×2): 10 mg via INTRAVENOUS
  Administered 2017-07-11: 20 mg via INTRAVENOUS

## 2017-07-11 MED ORDER — MIDAZOLAM HCL 2 MG/2ML IJ SOLN: 0.5000 mg | Freq: Once | INTRAMUSCULAR | Status: DC | PRN

## 2017-07-11 MED ORDER — LIDOCAINE 2% (20 MG/ML) 5 ML SYRINGE
INTRAMUSCULAR | Status: AC
  Filled 2017-07-11: qty 5

## 2017-07-11 MED ORDER — ONDANSETRON HCL 4 MG/2ML IJ SOLN
INTRAMUSCULAR | Status: AC
  Filled 2017-07-11: qty 2

## 2017-07-11 MED ORDER — TRAMADOL HCL 50 MG PO TABS: 100.0000 mg | ORAL_TABLET | Freq: Two times a day (BID) | ORAL | Status: DC | PRN

## 2017-07-11 MED ORDER — PROMETHAZINE HCL 25 MG/ML IJ SOLN: 6.2500 mg | INTRAMUSCULAR | Status: DC | PRN

## 2017-07-11 MED ORDER — DEXAMETHASONE SODIUM PHOSPHATE 10 MG/ML IJ SOLN
INTRAMUSCULAR | Status: DC | PRN
  Administered 2017-07-11: 10 mg via INTRAVENOUS

## 2017-07-11 MED ORDER — HYDROMORPHONE HCL 1 MG/ML IJ SOLN
INTRAMUSCULAR | Status: AC
  Filled 2017-07-11: qty 1

## 2017-07-11 MED ORDER — CELECOXIB 200 MG PO CAPS
400.0000 mg | ORAL_CAPSULE | ORAL | Status: AC
  Administered 2017-07-11: 400 mg via ORAL
  Filled 2017-07-11: qty 2

## 2017-07-11 MED ORDER — ONDANSETRON HCL 4 MG PO TABS: 4.0000 mg | ORAL_TABLET | Freq: Four times a day (QID) | ORAL | Status: DC | PRN

## 2017-07-11 MED ORDER — HYDROMORPHONE HCL 1 MG/ML IJ SOLN
0.2500 mg | INTRAMUSCULAR | Status: DC | PRN
  Administered 2017-07-11 (×2): 0.5 mg via INTRAVENOUS

## 2017-07-11 MED ORDER — SCOPOLAMINE 1 MG/3DAYS TD PT72
1.0000 | MEDICATED_PATCH | TRANSDERMAL | Status: DC
  Administered 2017-07-11: 1.5 mg via TRANSDERMAL
  Filled 2017-07-11: qty 1

## 2017-07-11 MED ORDER — ROCURONIUM BROMIDE 50 MG/5ML IV SOSY
PREFILLED_SYRINGE | INTRAVENOUS | Status: AC
  Filled 2017-07-11: qty 10

## 2017-07-11 MED ORDER — INDOCYANINE GREEN 25 MG IV SOLR
INTRAVENOUS | Status: DC | PRN
  Administered 2017-07-11: 2.5 mg via INTRAVENOUS

## 2017-07-11 MED ORDER — DEXAMETHASONE SODIUM PHOSPHATE 10 MG/ML IJ SOLN
INTRAMUSCULAR | Status: AC
  Filled 2017-07-11: qty 1

## 2017-07-11 MED ORDER — GABAPENTIN 300 MG PO CAPS
300.0000 mg | ORAL_CAPSULE | ORAL | Status: AC
  Administered 2017-07-11: 300 mg via ORAL
  Filled 2017-07-11: qty 1

## 2017-07-11 MED ORDER — OXYCODONE HCL 5 MG PO TABS: 5.0000 mg | ORAL_TABLET | ORAL | Status: DC | PRN

## 2017-07-11 MED ORDER — GLYCOPYRROLATE 0.2 MG/ML IJ SOLN
INTRAMUSCULAR | Status: DC | PRN
  Administered 2017-07-11: 0.2 mg via INTRAVENOUS

## 2017-07-11 MED ORDER — SCOPOLAMINE 1 MG/3DAYS TD PT72
MEDICATED_PATCH | TRANSDERMAL | Status: AC
  Filled 2017-07-11: qty 1

## 2017-07-11 MED ORDER — SUGAMMADEX SODIUM 500 MG/5ML IV SOLN
INTRAVENOUS | Status: DC | PRN
  Administered 2017-07-11: 300 mg via INTRAVENOUS

## 2017-07-11 MED ORDER — GLYCOPYRROLATE 0.2 MG/ML IV SOSY
PREFILLED_SYRINGE | INTRAVENOUS | Status: AC
  Filled 2017-07-11: qty 5

## 2017-07-11 MED ORDER — FENTANYL CITRATE (PF) 250 MCG/5ML IJ SOLN
INTRAMUSCULAR | Status: AC
  Filled 2017-07-11: qty 5

## 2017-07-11 MED ORDER — STERILE WATER FOR INJECTION IJ SOLN
INTRAMUSCULAR | Status: DC | PRN
  Administered 2017-07-11: 4 mL

## 2017-07-11 MED ORDER — MEPERIDINE HCL 50 MG/ML IJ SOLN: 6.2500 mg | INTRAMUSCULAR | Status: DC | PRN

## 2017-07-11 MED ORDER — ACETAMINOPHEN 500 MG PO TABS
1000.0000 mg | ORAL_TABLET | ORAL | Status: AC
  Administered 2017-07-11: 1000 mg via ORAL
  Filled 2017-07-11: qty 2

## 2017-07-11 MED ORDER — LIDOCAINE HCL (CARDIAC) 20 MG/ML IV SOLN
INTRAVENOUS | Status: DC | PRN
Start: 2017-07-11 — End: 2017-07-11
  Administered 2017-07-11: 60 mg via INTRAVENOUS

## 2017-07-11 MED ORDER — STERILE WATER FOR INJECTION IJ SOLN
INTRAMUSCULAR | Status: AC
  Filled 2017-07-11: qty 10

## 2017-07-11 MED ORDER — LEVOTHYROXINE SODIUM 100 MCG PO TABS
100.0000 ug | ORAL_TABLET | Freq: Every day | ORAL | Status: DC
  Administered 2017-07-12: 100 ug via ORAL
  Filled 2017-07-11: qty 1

## 2017-07-11 MED ORDER — LACTATED RINGERS IR SOLN
Status: DC | PRN
  Administered 2017-07-11: 1000 mL

## 2017-07-11 MED ORDER — ONDANSETRON HCL 4 MG/2ML IJ SOLN
INTRAMUSCULAR | Status: DC | PRN
  Administered 2017-07-11: 4 mg via INTRAVENOUS

## 2017-07-11 MED ORDER — ENOXAPARIN SODIUM 40 MG/0.4ML ~~LOC~~ SOLN
40.0000 mg | SUBCUTANEOUS | Status: AC
  Administered 2017-07-11: 40 mg via SUBCUTANEOUS
  Filled 2017-07-11: qty 0.4

## 2017-07-11 MED ORDER — OXYCODONE-ACETAMINOPHEN 5-325 MG PO TABS: 1.0000 | ORAL_TABLET | ORAL | Status: DC | PRN

## 2017-07-11 MED ORDER — SENNOSIDES-DOCUSATE SODIUM 8.6-50 MG PO TABS
2.0000 | ORAL_TABLET | Freq: Every day | ORAL | Status: DC
  Administered 2017-07-11: 2 via ORAL
  Filled 2017-07-11: qty 2

## 2017-07-11 MED ORDER — DEXAMETHASONE SODIUM PHOSPHATE 4 MG/ML IJ SOLN: 4.0000 mg | INTRAMUSCULAR | Status: DC

## 2017-07-11 MED ORDER — PROPOFOL 10 MG/ML IV BOLUS
INTRAVENOUS | Status: DC | PRN
Start: 2017-07-11 — End: 2017-07-11
  Administered 2017-07-11: 100 mg via INTRAVENOUS

## 2017-07-11 MED ORDER — MIDAZOLAM HCL 2 MG/2ML IJ SOLN
INTRAMUSCULAR | Status: AC
  Filled 2017-07-11: qty 2

## 2017-07-11 MED ORDER — MIDAZOLAM HCL 5 MG/5ML IJ SOLN
INTRAMUSCULAR | Status: DC | PRN
  Administered 2017-07-11 (×2): 0.5 mg via INTRAVENOUS

## 2017-07-11 MED ORDER — IBUPROFEN 800 MG PO TABS: 800.0000 mg | ORAL_TABLET | Freq: Three times a day (TID) | ORAL | Status: DC | PRN

## 2017-07-11 MED ORDER — ONDANSETRON HCL 4 MG/2ML IJ SOLN: 4.0000 mg | Freq: Four times a day (QID) | INTRAMUSCULAR | Status: DC | PRN

## 2017-07-11 MED ORDER — KCL IN DEXTROSE-NACL 20-5-0.45 MEQ/L-%-% IV SOLN
INTRAVENOUS | Status: DC
  Filled 2017-07-11: qty 1000

## 2017-07-11 SURGICAL SUPPLY — 49 items
APPLICATOR SURGIFLO ENDO (HEMOSTASIS) IMPLANT
BAG LAPAROSCOPIC 12 15 PORT 16 (BASKET) IMPLANT
BAG RETRIEVAL 12/15 (BASKET)
COVER BACK TABLE 60X90IN (DRAPES) ×2 IMPLANT
COVER TIP SHEARS 8 DVNC (MISCELLANEOUS) ×1 IMPLANT
COVER TIP SHEARS 8MM DA VINCI (MISCELLANEOUS) ×1
DERMABOND ADVANCED (GAUZE/BANDAGES/DRESSINGS) ×1
DERMABOND ADVANCED .7 DNX12 (GAUZE/BANDAGES/DRESSINGS) ×1 IMPLANT
DRAPE ARM DVNC X/XI (DISPOSABLE) ×4 IMPLANT
DRAPE COLUMN DVNC XI (DISPOSABLE) ×1 IMPLANT
DRAPE DA VINCI XI ARM (DISPOSABLE) ×4
DRAPE DA VINCI XI COLUMN (DISPOSABLE) ×1
DRAPE SHEET LG 3/4 BI-LAMINATE (DRAPES) ×2 IMPLANT
DRAPE SURG IRRIG POUCH 19X23 (DRAPES) ×2 IMPLANT
ELECT REM PT RETURN 15FT ADLT (MISCELLANEOUS) ×2 IMPLANT
GLOVE BIO SURGEON STRL SZ 6 (GLOVE) ×8 IMPLANT
GLOVE BIO SURGEON STRL SZ 6.5 (GLOVE) ×4 IMPLANT
GOWN STRL REUS W/ TWL LRG LVL3 (GOWN DISPOSABLE) ×2 IMPLANT
GOWN STRL REUS W/TWL LRG LVL3 (GOWN DISPOSABLE) ×2
HOLDER FOLEY CATH W/STRAP (MISCELLANEOUS) ×2 IMPLANT
IRRIG SUCT STRYKERFLOW 2 WTIP (MISCELLANEOUS) ×2
IRRIGATION SUCT STRKRFLW 2 WTP (MISCELLANEOUS) ×1 IMPLANT
KIT PROCEDURE DA VINCI SI (MISCELLANEOUS) ×1
KIT PROCEDURE DVNC SI (MISCELLANEOUS) ×1 IMPLANT
MANIPULATOR UTERINE 4.5 ZUMI (MISCELLANEOUS) ×2 IMPLANT
NDL SAFETY ECLIPSE 18X1.5 (NEEDLE) ×1 IMPLANT
NEEDLE HYPO 18GX1.5 SHARP (NEEDLE) ×1
NEEDLE SPNL 18GX3.5 QUINCKE PK (NEEDLE) ×2 IMPLANT
OBTURATOR OPTICAL STANDARD 8MM (TROCAR) ×1
OBTURATOR OPTICAL STND 8 DVNC (TROCAR) ×1
OBTURATOR OPTICALSTD 8 DVNC (TROCAR) ×1 IMPLANT
PACK ROBOT GYN CUSTOM WL (TRAY / TRAY PROCEDURE) ×2 IMPLANT
PAD POSITIONING PINK XL (MISCELLANEOUS) ×2 IMPLANT
POUCH SPECIMEN RETRIEVAL 10MM (ENDOMECHANICALS) ×2 IMPLANT
SEAL CANN UNIV 5-8 DVNC XI (MISCELLANEOUS) ×4 IMPLANT
SEAL XI 5MM-8MM UNIVERSAL (MISCELLANEOUS) ×4
SET TRI-LUMEN FLTR TB AIRSEAL (TUBING) ×2 IMPLANT
SOLUTION ELECTROLUBE (MISCELLANEOUS) ×2 IMPLANT
SURGIFLO W/THROMBIN 8M KIT (HEMOSTASIS) IMPLANT
SUT VIC AB 0 CT1 27 (SUTURE) ×1
SUT VIC AB 0 CT1 27XBRD ANTBC (SUTURE) ×1 IMPLANT
SUT VIC AB 3-0 SH 27 (SUTURE) ×1
SUT VIC AB 3-0 SH 27XBRD (SUTURE) ×1 IMPLANT
SYR 10ML LL (SYRINGE) ×2 IMPLANT
TOWEL OR NON WOVEN STRL DISP B (DISPOSABLE) ×2 IMPLANT
TRAP SPECIMEN MUCOUS 40CC (MISCELLANEOUS) IMPLANT
TRAY FOLEY W/METER SILVER 16FR (SET/KITS/TRAYS/PACK) ×2 IMPLANT
UNDERPAD 30X30 (UNDERPADS AND DIAPERS) ×2 IMPLANT
WATER STERILE IRR 1000ML POUR (IV SOLUTION) ×2 IMPLANT

## 2017-07-11 NOTE — Transfer of Care (Signed)
Immediate Anesthesia Transfer of Care Note  Patient: Carol Lambert  Procedure(s) Performed: XI ROBOTIC ASSISTED TOTAL HYSTERECTOMY WITH BILATERAL SALPINGO OOPHORECTOMY WITH SENTINAL LYMPH NODES (Bilateral )  Patient Location: PACU  Anesthesia Type:General  Level of Consciousness: awake, oriented and patient cooperative  Airway & Oxygen Therapy: Patient Spontanous Breathing and Patient connected to face mask oxygen  Post-op Assessment: Report given to RN, Post -op Vital signs reviewed and stable and Patient moving all extremities X 4  Post vital signs: Reviewed and stable  Last Vitals:  Vitals:   07/11/17 1133  BP: (!) 124/51  Pulse: 64  Resp: 16  Temp: (!) 36.4 C  SpO2: 99%    Last Pain:  Vitals:   07/11/17 1133  TempSrc: Oral      Patients Stated Pain Goal: 4 (81/18/86 [id number])  Complications: No apparent anesthesia complications

## 2017-07-11 NOTE — Anesthesia Preprocedure Evaluation (Addendum)
Anesthesia Evaluation  Patient identified by MRN, date of birth, ID band Patient awake    Reviewed: Allergy & Precautions, NPO status , Patient's Chart, lab work & pertinent test results  History of Anesthesia Complications Negative for: history of anesthetic complications  Airway Mallampati: II  TM Distance: >3 FB Neck ROM: Full    Dental  (+) Dental Advisory Given   Pulmonary neg pulmonary ROS,    breath sounds clear to auscultation       Cardiovascular hypertension, Pt. on medications (-) angina Rhythm:Regular Rate:Normal     Neuro/Psych negative neurological ROS     GI/Hepatic negative GI ROS, Neg liver ROS, neg GERD  ,  Endo/Other  Hypothyroidism   Renal/GU negative Renal ROS     Musculoskeletal   Abdominal   Peds  Hematology negative hematology ROS (+)   Anesthesia Other Findings   Reproductive/Obstetrics                            Anesthesia Physical Anesthesia Plan  ASA: II  Anesthesia Plan: General   Post-op Pain Management:    Induction: Intravenous  PONV Risk Score and Plan: 4 or greater and Scopolamine patch - Pre-op, Midazolam, Dexamethasone and Ondansetron  Airway Management Planned: Oral ETT  Additional Equipment:   Intra-op Plan:   Post-operative Plan: Extubation in OR  Informed Consent: I have reviewed the patients History and Physical, chart, labs and discussed the procedure including the risks, benefits and alternatives for the proposed anesthesia with the patient or authorized representative who has indicated his/her understanding and acceptance.   Dental advisory given  Plan Discussed with: CRNA and Surgeon  Anesthesia Plan Comments: (Plan routine monitors, GETA)        Anesthesia Quick Evaluation

## 2017-07-11 NOTE — Anesthesia Procedure Notes (Signed)

## 2017-07-11 NOTE — Discharge Instructions (Signed)
07/11/2017  Return to work: 4 weeks  Activity: 1. Be up and out of the bed during the day.  Take a nap if needed.  You may walk up steps but be careful and use the hand rail.  Stair climbing will tire you more than you think, you may need to stop part way and rest.   2. No lifting or straining for 6 weeks.  3. No driving for 1 weeks.  Do Not drive if you are taking narcotic pain medicine.  4. Shower daily.  Use soap and water on your incision and pat dry; don't rub.   5. No sexual activity and nothing in the vagina for 4=8 weeks.  Medications:  - Take ibuprofen and tylenol first line for pain control. Take these regularly (every 6 hours) to decrease the build up of pain.  - If necessary, for severe pain not relieved by ibuprofen, take percocet.  - While taking percocet you should take sennakot every night to reduce the likelihood of constipation. If this causes diarrhea, stop its use.  Diet: 1. Low sodium Heart Healthy Diet is recommended.  2. It is safe to use a laxative if you have difficulty moving your bowels.   Wound Care: 1. Keep clean and dry.  Shower daily.  Reasons to call the Doctor:   Fever - Oral temperature greater than 100.4 degrees Fahrenheit  Foul-smelling vaginal discharge  Difficulty urinating  Nausea and vomiting  Increased pain at the site of the incision that is unrelieved with pain medicine.  Difficulty breathing with or without chest pain  New calf pain especially if only on one side  Sudden, continuing increased vaginal bleeding with or without clots.   Follow-up: 1. See Everitt Amber in 3 weeks.  Contacts: For questions or concerns you should contact:  Dr. Everitt Amber at [phone] After hours and on week-ends call [phone] and ask to speak to the physician on call for Gynecologic Oncology

## 2017-07-11 NOTE — Anesthesia Postprocedure Evaluation (Signed)
Anesthesia Post Note  Patient: Scientist, research (life sciences)  Procedure(s) Performed: XI ROBOTIC ASSISTED TOTAL HYSTERECTOMY WITH BILATERAL SALPINGO OOPHORECTOMY WITH SENTINAL LYMPH NODES (Bilateral )     Patient location during evaluation: PACU Anesthesia Type: General Level of consciousness: awake and alert Pain management: pain level controlled Vital Signs Assessment: post-procedure vital signs reviewed and stable Respiratory status: spontaneous breathing, nonlabored ventilation, respiratory function stable and patient connected to nasal cannula oxygen Cardiovascular status: blood pressure returned to baseline and stable Postop Assessment: no apparent nausea or vomiting Anesthetic complications: no    Last Vitals:  Vitals:   07/11/17 1745 07/11/17 1757  BP: 116/61 123/64  Pulse: (!) 58 (!) 57  Resp: 12 13  Temp:  36.8 C  SpO2: 100% 100%    Last Pain:  Vitals:   07/11/17 1730  TempSrc:   PainSc: 2                  Effie Berkshire

## 2017-07-11 NOTE — H&P (Signed)
CC:     Chief Complaint  Patient presents with  . Endometrial cancer [name])    Assessment/Plan:  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  A detailed discussion was held with the patient and her family with regard to to her endometrial cancer diagnosis. We discussed the standard management options for uterine cancer which includes surgery followed possibly by adjuvant therapy depending on the results of surgery. The options for surgical management include a hysterectomy and removal of the tubes and ovaries possibly with removal of pelvic and para-aortic lymph nodes.If feasible, a minimally invasive approach including a robotic hysterectomy or laparoscopic hysterectomy have benefits including shorter hospital stay, recovery time and better wound healing than with open surgery. The patient has been counseled about these surgical options and the risks of surgery in general including infection, bleeding, damage to surrounding structures (including bowel, bladder, ureters, nerves or vessels), and the postoperative risks of PE/ DVT, and lymphedema. I extensively reviewed the additional risks of robotic hysterectomy including possible need for conversion to open laparotomy.  I discussed positioning during surgery of trendelenberg and risks of minor facial swelling and care we take in preoperative positioning.  After counseling and consideration of her options, she desires to proceed with robotic assisted total hysterectomy with bilateral sapingo-oophorectomy and SLN biopsy.  Due to her nulliparous narrow vagina, she may have some abrasions to the vagina at the time of surgery and I counseled her about this.  She will be seen by anesthesia for preoperative clearance and discussion of postoperative pain management.  She was given the opportunity to ask questions, which were answered to her satisfaction, and she is agreement with the above mentioned plan of care.  I  discussed that due to her high grade cancer we will order a preoperative CT scan with IV contrast.  I discussed that due to her high grade cancer there is a high probability for adjuvant therapy being recommended (vaginal brachytherapy, and possibly chemotherapy).  She takes 81mg  ASA daily, and I have counseled her to stop this prior to surgery.  HPI: Carol Lambert is a 76 year old P0 who is seen in consultation at the request of [name] with high grade endometrial cancer.  The patient has a history of postmenopausal bleeding since September, 2018. She reported this to her OBGYN who performed a TVUS on 05/03/17 which showed a 7.9cm uterus (retroverted) with an 53mm thickened endometrium.  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   She stopped bleeding after the procedure.  She is very healthy with no major medical ailments besides hypothyroidism. She has a 25 year history of postmenopausal prempro usage (stopped this in 2016). She has had no prior abdominal surgeries. Her BMI is 24mg /m2. She takes 81mg  ASA daily. She has a maternal aunt with a history of breast cancer at age 28.  Current Meds:  Outpatient Encounter Prescriptions as of 06/09/2017  Medication Sig  . escitalopram (LEXAPRO) 10 MG tablet Take 10 mg by mouth daily.  Marland Kitchen levothyroxine (SYNTHROID, LEVOTHROID) 100 MCG tablet Take 100 mcg by mouth daily before breakfast.  . [DISCONTINUED] cephALEXin (KEFLEX) 500 MG capsule Take 1 capsule (500 mg total) by mouth 3 (three) times daily.  . [DISCONTINUED] estrogen, conjugated,-medroxyprogesterone (PREMPRO) 0.3-1.5 MG per tablet Take 1 tablet by mouth daily.     No facility-administered encounter  medications on file as of 06/09/2017.     Allergy: No Known Allergies  Social Hx:   Social History        Social History  .  Marital status: Married    Spouse name: N/A  . Number of children: N/A  . Years of education: N/A      Occupational History  . Not on file.       Social History Main Topics  . Smoking status: Never Smoker  . Smokeless tobacco: Never Used  . Alcohol use Yes  . Drug use: No  . Sexual activity: Not on file       Other Topics Concern  . Not on file      Social History Narrative  . No narrative on file    Past Surgical Hx:       Past Surgical History:  Procedure Laterality Date  . KNEE SURGERY Right   . LENS MATERIAL REMOVAL      Past Medical Hx:      Past Medical History:  Diagnosis Date  . Thyroid disease     Past Gynecological History:  G0 No LMP recorded. Patient is postmenopausal.  Family Hx:       Family History  Problem Relation Age of Onset  . Diabetes Mother   . Heart failure Father   . Breast cancer Maternal Aunt   . Cancer Maternal Aunt     Review of Systems:  Constitutional  Feels well,    ENT Normal appearing ears and nares bilaterally Skin/Breast  No rash, sores, jaundice, itching, dryness Cardiovascular  No chest pain, shortness of breath, or edema  Pulmonary  No cough or wheeze.  Gastro Intestinal  No nausea, vomitting, or diarrhoea. No bright red blood per rectum, no abdominal pain, change in bowel movement, or constipation.  Genito Urinary  No frequency, urgency, dysuria, + postmenopausal bleeding Musculo Skeletal  No myalgia, arthralgia, joint swelling or pain  Neurologic  No weakness, numbness, change in gait,  Psychology  No depression, anxiety, insomnia.   Vitals:  Blood pressure 127/62, pulse (!) 52, temperature 97.7 F (36.5 C), temperature source Oral, resp. rate 18, height 5' 2.5" (1.588 m), weight 135 lb 9.6 oz (61.5 kg), SpO2 100 %.  Physical Exam: WD in NAD Neck  Supple NROM, without any enlargements.  Lymph Node Survey No cervical supraclavicular or inguinal  adenopathy Cardiovascular  Pulse normal rate, regularity and rhythm. S1 and S2 normal.  Lungs  Clear to auscultation bilateraly, without wheezes/crackles/rhonchi. Good air movement.  Skin  No rash/lesions/breakdown  Psychiatry  Alert and oriented to person, place, and time  Abdomen  Normoactive bowel sounds, abdomen soft, non-tender and thin without evidence of hernia.  Back No CVA tenderness Genito Urinary  Vulva/vagina: Normal external female genitalia.   No lesions. No discharge or bleeding.             Bladder/urethra:  No lesions or masses, well supported bladder             Vagina: normal, narrow             Cervix: Normal appearing, no lesions.             Uterus: retroverted, Small, mobile, no parametrial involvement or nodularity.             Adnexa: no palpable masses. Rectal  deferred Extremities  No bilateral cyanosis, clubbing or edema.   Donaciano Eva, MD

## 2017-07-11 NOTE — Op Note (Signed)
OPERATIVE NOTE 07/11/17  Surgeon: Donaciano Eva   Assistants: Dr Lahoma Crocker (an MD assistant was necessary for tissue manipulation, management of robotic instrumentation, retraction and positioning due to the complexity of the case and hospital policies).   Anesthesia: Carol endotracheal anesthesia  ASA Class: 3   Pre-operative Diagnosis: endometrial cancer grade 3 (clear cell)  Post-operative Diagnosis: same,  Operation: Robotic-assisted laparoscopic total hysterectomy with bilateral salpingoophorectomy, SLN biopsy, para-aortic lymphadenectomy.  Surgeon: Donaciano Eva  Assistant Surgeon: Lahoma Crocker MD  Anesthesia: GET  Urine Output: 200cc  Operative Findings:  : 8cm fibroid uterus, normal ovaries, firm slightly enlarged right common iliac SLN, otherwise no suspicious findings for extrauterine disease.  Estimated Blood Loss:  less than 50 mL      Total IV Fluids: 1,000 ml         Specimens: uterus, cervix, bilateral tubes and ovaries, right obturator SLN, right common iliac SLN, left obturator SLN, left common iliac SLN.          Complications:  None; patient tolerated the procedure well.         Disposition: PACU - hemodynamically stable.  Procedure Details  The patient was seen in the Holding Room. The risks, benefits, complications, treatment options, and expected outcomes were discussed with the patient.  The patient concurred with the proposed plan, giving informed consent.  The site of surgery properly noted/marked. The patient was identified as Carol Lambert and the procedure verified as a Robotic-assisted hysterectomy with bilateral salpingo oophorectomy with SLN biopsy. A Time Out was held and the above information confirmed.  After induction of anesthesia, the patient was draped and prepped in the usual sterile manner. Pt was placed in supine position after anesthesia and draped and prepped in the usual sterile manner. The abdominal  drape was placed after the CholoraPrep had been allowed to dry for 3 minutes.  Her arms were tucked to her side with all appropriate precautions.  The shoulders were stabilized with padded shoulder blocks applied to the acromium processes.  The patient was placed in the semi-lithotomy position in Dix.  The perineum was prepped with Betadine. The patient was then prepped. Foley catheter was placed.  A sterile speculum was placed in the vagina.  The cervix was grasped with a single-tooth tenaculum. 2mg  total of ICG was injected into the cervical stroma at 2 and 9 o'clock with 1cc injected at a 1cm and 21mm depth (concentration 0.5mg /ml) in all locations. The cervix was dilated with Carol Lambert dilators.  The Carol Lambert uterine manipulator with a medium colpotomizer ring was placed without difficulty.  A pneum occluder balloon was placed over the manipulator.  OG tube placement was confirmed and to suction.   Next, a 5 mm skin incision was made 1 cm below the subcostal margin in the midclavicular line.  The 5 mm Optiview port and scope was used for direct entry.  Opening pressure was under 10 mm CO2.  The abdomen was insufflated and the findings were noted as above.   At this point and all points during the procedure, the patient's intra-abdominal pressure did not exceed 15 mmHg. Next, a 10 mm skin incision was made in the umbilicus and a right and left port was placed about 10 cm lateral to the robot port on the right and left side.  A fourth arm was placed in the left lower quadrant 2 cm above and superior and medial to the anterior superior iliac spine.  All ports were placed under  direct visualization.  The patient was placed in steep Trendelenburg.  Bowel was folded away into the upper abdomen.  The robot was docked in the normal manner.  The right and left peritoneum were opened parallel to the IP ligament to open the retroperitoneal spaces bilaterally. The SLN mapping was performed in bilateral pelvic basins.  The para rectal and paravesical spaces were opened up entirely with careful dissection below the level of the ureters bilaterally and to the depth of the uterine artery origin in order to skeletonize the uterine "web" and ensure visualization of all parametrial channels. The para-aortic basins were carefully exposed and evaluated for isolated para-aortic SLN's. Lymphatic channels were identified travelling to the following visualized sentinel lymph node's: right obturator and common iliac SLN and left obturator and common iliac SLN. The right common iliac SLN was firm and enlarged and suspicious for metastatic disease. It rested at the mid portion of the common iliac node overlying the distal vena cava (para-aortic region). These SLN's were separated from their surrounding lymphatic tissue, removed and sent for permanent pathology. The para-aortic lymph node dissection was performed on the right to remove the common iliac node. The peritoneum overlying the lateral surface of the common iliac and vena cava was opened vertically towards the cecum. The ureter was identified in this retroperitoneum. It was mobilized and retracted medially with the 4th arm. The right para-aortic dissection took place by separating an enbloc segment of node containing fat from the mid portion of the common iliac distally, the inferior aspect of the IVC superiorally (inframesenteric level).  The hysterectomy was started after the round ligament on the right side was incised and the retroperitoneum was entered and the pararectal space was developed.  The ureter was noted to be on the medial leaf of the broad ligament.  The peritoneum above the ureter was incised and stretched and the infundibulopelvic ligament was skeletonized, cauterized and cut.  The posterior peritoneum was taken down to the level of the KOH ring.  The anterior peritoneum was also taken down.  The bladder flap was created to the level of the KOH ring.  The uterine  artery on the right side was skeletonized, cauterized and cut in the normal manner.  A similar procedure was performed on the left.  The colpotomy was made and the uterus, cervix, bilateral ovaries and tubes were amputated and delivered through the vagina.  Pedicles were inspected and excellent hemostasis was achieved.    The colpotomy at the vaginal cuff was closed with Vicryl on a CT1 needle in a figure of 8 manner.  Irrigation was used and excellent hemostasis was achieved.  At this point in the procedure was completed.  Robotic instruments were removed under direct visulaization.  The robot was undocked. The 10 mm ports were closed with Vicryl on a UR-5 needle and the fascia was closed with 0 Vicryl on a UR-5 needle.  The skin was closed with 4-0 Vicryl in a subcuticular manner.  Dermabond was applied.  A figure of 8 suture was placed at the introitus to create hemostasis where there was oozing at the perineum in the midline. Sponge, lap and needle counts correct x 2.  The patient was taken to the recovery room in stable condition.  The vagina was swabbed with  minimal bleeding noted.   All instrument and needle counts were correct x  3.   The patient was transferred to the recovery room in a stable condition.  Donaciano Eva, MD

## 2017-07-12 ENCOUNTER — Encounter (HOSPITAL_COMMUNITY): Payer: Self-pay | Admitting: Gynecologic Oncology

## 2017-07-12 DIAGNOSIS — C541 Malignant neoplasm of endometrium: Secondary | ICD-10-CM | POA: Diagnosis not present

## 2017-07-12 LAB — CBC
HEMATOCRIT: 39.7 % (ref 36.0–46.0)
HEMOGLOBIN: 13.5 g/dL (ref 12.0–15.0)
MCH: 31.3 pg (ref 26.0–34.0)
MCHC: 34 g/dL (ref 30.0–36.0)
MCV: 91.9 fL (ref 78.0–100.0)
Platelets: 230 10*3/uL (ref 150–400)
RBC: 4.32 MIL/uL (ref 3.87–5.11)
RDW: 12.2 % (ref 11.5–15.5)
WBC: 10.9 10*3/uL — ABNORMAL HIGH (ref 4.0–10.5)

## 2017-07-12 LAB — BASIC METABOLIC PANEL
Anion gap: 8 (ref 5–15)
BUN: 18 mg/dL (ref 6–20)
CHLORIDE: 105 mmol/L (ref 101–111)
CO2: 25 mmol/L (ref 22–32)
CREATININE: 0.7 mg/dL (ref 0.44–1.00)
Calcium: 8.8 mg/dL — ABNORMAL LOW (ref 8.9–10.3)
GFR calc non Af Amer: >60 mL/min (ref >60)
GLUCOSE: 128 mg/dL — AB (ref 65–99)
Potassium: 4.2 mmol/L (ref 3.5–5.1)
Sodium: 138 mmol/L (ref 135–145)

## 2017-07-12 MED ORDER — IBUPROFEN 800 MG PO TABS: 800.0000 mg | ORAL_TABLET | Freq: Three times a day (TID) | ORAL | 0 refills | Status: AC | PRN

## 2017-07-12 MED ORDER — SENNOSIDES-DOCUSATE SODIUM 8.6-50 MG PO TABS: 2.0000 | ORAL_TABLET | Freq: Every day | ORAL | 1 refills | Status: DC

## 2017-07-12 NOTE — Progress Notes (Signed)
Discharge instructions reviewed with patient. All questions answered. Patient wheeled down to vehicle with belongings by nurse tech. 

## 2017-07-12 NOTE — Discharge Summary (Signed)
Physician Discharge Summary  Patient ID: Carol Lambert MRN: [id number] DOB/AGE: 1941-01-23 76 y.o.  Admit date: 07/11/2017 Discharge date: 07/12/2017  Admission Diagnoses: Endometrial cancer [facility])  Discharge Diagnoses:  Principal Problem:   Endometrial cancer [facility])   Discharged Condition: good  Hospital Course:  1/ patient was admitted on 07/11/17 for a robotic hysterectomy, BSO and SLN biopsy for endometrial cancer 2/ surgery was uncomplicated  3/ on postoperative day 1 the patient was meeting discharge criteria: tolerating PO, voiding urine, ambulating, pain well controlled on oral medications.  4/ new medications on discharge include senokot and ibuprofen. She declined opioid pain medications.  Consults: None  Significant Diagnostic Studies: labs:  CBC    Component Value Date/Time   WBC 10.9 (H) 07/12/2017 0551   RBC 4.32 07/12/2017 0551   HGB 13.5 07/12/2017 0551   HCT 39.7 07/12/2017 0551   PLT 230 07/12/2017 0551   MCV 91.9 07/12/2017 0551   MCH 31.3 07/12/2017 0551   MCHC 34.0 07/12/2017 0551   RDW 12.2 07/12/2017 0551     Treatments: surgery: see above  Discharge Exam: Blood pressure (!) 108/44, pulse 72, temperature 99.1 F (37.3 C), temperature source Oral, resp. rate 16, height 5' 2.5" (1.588 m), weight 136 lb (61.7 kg), SpO2 99 %. General appearance: alert and cooperative GI: soft, non-tender; bowel sounds normal; no masses,  no organomegaly Incision/Wound: clean and in tact  Disposition: 01-Home or Self Care  Discharge Instructions    (HEART FAILURE PATIENTS) Call MD:  Anytime you have any of the following symptoms: 1) 3 pound weight gain in 24 hours or 5 pounds in 1 week 2) shortness of breath, with or without a dry hacking cough 3) swelling in the hands, feet or stomach 4) if you have to sleep on extra pillows at night in order to breathe.   Complete by:  As directed    Call MD for:  difficulty breathing, headache or visual disturbances   Complete  by:  As directed    Call MD for:  extreme fatigue   Complete by:  As directed    Call MD for:  hives   Complete by:  As directed    Call MD for:  persistant dizziness or light-headedness   Complete by:  As directed    Call MD for:  persistant nausea and vomiting   Complete by:  As directed    Call MD for:  redness, tenderness, or signs of infection (pain, swelling, redness, odor or green/yellow discharge around incision site)   Complete by:  As directed    Call MD for:  severe uncontrolled pain   Complete by:  As directed    Call MD for:  temperature >100.4   Complete by:  As directed    Diet - low sodium heart healthy   Complete by:  As directed    Diet general   Complete by:  As directed    Driving Restrictions   Complete by:  As directed    No driving for 7 days or until off narcotic pain medication   Increase activity slowly   Complete by:  As directed    Remove dressing in 24 hours   Complete by:  As directed    Sexual Activity Restrictions   Complete by:  As directed    No intercourse for 6 weeks     Allergies as of 07/12/2017   No Known Allergies     Medication List    TAKE these medications  CALCIUM/VITAMIN D PO Take 1 tablet daily by mouth.   ibuprofen 800 MG tablet Commonly known as:  ADVIL,MOTRIN Take 1 tablet (800 mg total) every 8 (eight) hours as needed by mouth (mild pain).   levothyroxine 100 MCG tablet Commonly known as:  SYNTHROID, LEVOTHROID Take 100 mcg by mouth daily before breakfast.   polyethylene glycol packet Commonly known as:  MIRALAX / GLYCOLAX Take 17 g daily by mouth.   REFRESH OP Apply 1 drop daily as needed to eye (dry eyes).   senna-docusate 8.6-50 MG tablet Commonly known as:  Senokot-S Take 2 tablets at bedtime by mouth.   SUDAFED 24 HOUR PO Take 1 tablet daily as needed by mouth (Sinus Pressure).   triamterene-hydrochlorothiazide 37.5-25 MG tablet Commonly known as:  MAXZIDE-25 Take 0.5 tablets every evening by  mouth.      Follow-up Information    Everitt Amber, MD In 3 weeks.   Specialty:  Obstetrics and Gynecology Contact information: [address] [place] [address] [phone]           Signed: Donaciano Eva 07/12/2017, 9:03 AM

## 2017-07-13 ENCOUNTER — Encounter (HOSPITAL_COMMUNITY): Payer: Medicare Other

## 2017-07-18 ENCOUNTER — Telehealth: Payer: Self-pay

## 2017-07-18 NOTE — Telephone Encounter (Signed)
Ms Pardy states that the incision in her umbilicus and on the right side of her abdomen are leaking clear fluid.   Afebrile.  She wanted to report this incase it needs attention.    She applied a sanitary napkin with pressure and the drainage has stopped. Told her that the fluid from the lymphnodes removed find the path of least resistance to drain.  The fluid gets reabsorbed as well per [facility].  If the drainage increases she can call the office.

## 2017-07-19 ENCOUNTER — Telehealth: Payer: Self-pay | Admitting: Gynecologic Oncology

## 2017-07-19 NOTE — Telephone Encounter (Signed)
Patient informed of final path results and Dr. Serita Grit recommendations for no further treatment.  All questions answered.  Advised to call for any needs.

## 2017-08-02 ENCOUNTER — Ambulatory Visit: Payer: Medicare Other | Attending: Gynecologic Oncology | Admitting: Gynecologic Oncology

## 2017-08-02 ENCOUNTER — Encounter: Payer: Self-pay | Admitting: Gynecologic Oncology

## 2017-08-02 VITALS — BP 103/41 | HR 63 | Temp 98.5°F | Resp 16 | Ht 62.5 in | Wt 137.1 lb

## 2017-08-02 DIAGNOSIS — Z833 Family history of diabetes mellitus: Secondary | ICD-10-CM | POA: Insufficient documentation

## 2017-08-02 DIAGNOSIS — Z8249 Family history of ischemic heart disease and other diseases of the circulatory system: Secondary | ICD-10-CM | POA: Diagnosis not present

## 2017-08-02 DIAGNOSIS — Z79899 Other long term (current) drug therapy: Secondary | ICD-10-CM | POA: Insufficient documentation

## 2017-08-02 DIAGNOSIS — Z7982 Long term (current) use of aspirin: Secondary | ICD-10-CM | POA: Diagnosis not present

## 2017-08-02 DIAGNOSIS — Z79890 Hormone replacement therapy (postmenopausal): Secondary | ICD-10-CM | POA: Insufficient documentation

## 2017-08-02 DIAGNOSIS — C541 Malignant neoplasm of endometrium: Secondary | ICD-10-CM | POA: Insufficient documentation

## 2017-08-02 DIAGNOSIS — Z7189 Other specified counseling: Secondary | ICD-10-CM

## 2017-08-02 DIAGNOSIS — Z90722 Acquired absence of ovaries, bilateral: Secondary | ICD-10-CM

## 2017-08-02 DIAGNOSIS — Z90710 Acquired absence of both cervix and uterus: Secondary | ICD-10-CM | POA: Insufficient documentation

## 2017-08-02 DIAGNOSIS — E039 Hypothyroidism, unspecified: Secondary | ICD-10-CM | POA: Insufficient documentation

## 2017-08-02 NOTE — Patient Instructions (Signed)
Please notify Dr Denman George at phone number [phone] if you notice vaginal bleeding, new pelvic or abdominal pains, bloating, feeling full easy, or a change in bladder or bowel function.   Please return to see Dr Denman George in May, 2019 and Dr Shelly Flatten in December, 2019.

## 2017-08-02 NOTE — Progress Notes (Signed)
Consult Note: Gyn-Onc  Consult was requested by Dr. Durene Fruits for the evaluation of Stanley 76 y.o. female  CC:  Chief Complaint  Patient presents with  . Endometrial cancer [facility])    Assessment/Plan:  Ms. Timberlyn Pickford  is a 76 y.o.  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  A detailed discussion was held with the patient and her family with regard to to her endometrial cancer diagnosis. We discussed the standard management options for uterine cancer which includes surgery followed possibly by adjuvant therapy depending on the results of surgery. The options for surgical management include a hysterectomy and removal of the tubes and ovaries possibly with removal of pelvic and para-aortic lymph nodes.If feasible, a minimally invasive approach including a robotic hysterectomy or laparoscopic hysterectomy have benefits including shorter hospital stay, recovery time and better wound healing than with open surgery. The patient has been counseled about these surgical options and the risks of surgery in general including infection, bleeding, damage to surrounding structures (including bowel, bladder, ureters, nerves or vessels), and the postoperative risks of PE/ DVT, and lymphedema. I extensively reviewed the additional risks of robotic hysterectomy including possible need for conversion to open laparotomy.  I discussed positioning during surgery of trendelenberg and risks of minor facial swelling and care we take in preoperative positioning.  After counseling and consideration of her options, she desires to proceed with robotic assisted total hysterectomy with bilateral sapingo-oophorectomy and SLN biopsy.  Due to her nulliparous narrow vagina, she may have some abrasions to the vagina at the time of surgery and I counseled her about this.  She will be seen by anesthesia for preoperative clearance and discussion of postoperative pain management.  She was given the opportunity to ask  questions, which were answered to her satisfaction, and she is agreement with the above mentioned plan of care.  I discussed that due to her high grade cancer we will order a preoperative CT scan with IV contrast.  I discussed that due to her high grade cancer there is a high probability for adjuvant therapy being recommended (vaginal brachytherapy, and possibly chemotherapy).  She takes 61m ASA daily, and I have counseled her to stop this prior to surgery.  HPI: SMishika Flippenis a 76year old P0 who is seen in consultation at the request of Dr DDurene Fruitswith high grade endometrial cancer.  The patient has a history of postmenopausal bleeding since September, 2018. She reported this to her OBGYN who performed a TVUS on 05/03/17 which showed a 7.9cm uterus (retroverted) with an 132mthickened endometrium.  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   She stopped bleeding after the procedure.  Interval Hx: On 07/11/17 she underwent robotic assisted total hysterectomy BSO pelvic and para-aortic sentinel lymph node biopsy.  Surgery was uncomplicated.  Postoperative pathology revealed a microscopic focus suspicious for residual carcinoma in the endometrium but otherwise no measurable foci of residual carcinoma in the hysterectomy specimen.  8 sentinel lymph nodes were negative for metastatic disease as well as the cervix, tubes and ovaries.  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 MSI testing could not be performed on the tumor because there was inadequate malignancy. She does not have a family history concerning for Lynch syndrome.  Since surgery she has done fairly well.  She has some abdominal bloating, and had some leakage  from some of her incisions which is now resolved.  She denies vaginal bleeding.  Her energy levels remain low.  Current Meds:  Outpatient Encounter Medications as of 08/02/2017  Medication Sig  . Calcium Carb-Cholecalciferol (CALCIUM/VITAMIN D PO) Take 1 tablet daily by mouth.  Marland Kitchen ibuprofen (ADVIL,MOTRIN) 800 MG tablet Take 1 tablet (800 mg total) every 8 (eight) hours as needed by mouth (mild pain).  Marland Kitchen levothyroxine (SYNTHROID, LEVOTHROID) 100 MCG tablet Take 100 mcg by mouth daily before breakfast.  . polyethylene glycol (MIRALAX / GLYCOLAX) packet Take 17 g daily by mouth.  . Polyvinyl Alcohol-Povidone (REFRESH OP) Apply 1 drop daily as needed to eye (dry eyes).  . Pseudoephedrine HCl (SUDAFED 24 HOUR PO) Take 1 tablet daily as needed by mouth (Sinus Pressure).  . senna-docusate (SENOKOT-S) 8.6-50 MG tablet Take 2 tablets at bedtime by mouth.  . triamterene-hydrochlorothiazide (MAXZIDE-25) 37.5-25 MG tablet Take 0.5 tablets every evening by mouth.   No facility-administered encounter medications on file as of 08/02/2017.     Allergy: No Known Allergies  Social Hx:   Social History   Socioeconomic History  . Marital status: Married    Spouse name: Not on file  . Number of children: Not on file  . Years of education: Not on file  . Highest education level: Not on file  Social Needs  . Financial resource strain: Not on file  . Food insecurity - worry: Not on file  . Food insecurity - inability: Not on file  . Transportation needs - medical: Not on file  . Transportation needs - non-medical: Not on file  Occupational History  . Not on file  Tobacco Use  . Smoking status: Never Smoker  . Smokeless tobacco: Never Used  Substance and Sexual Activity  . Alcohol use: Yes    Alcohol/week: 4.2 oz    Types: 7 Glasses of wine per week  . Drug use: No  . Sexual activity: Not on file  Other Topics Concern  . Not on file  Social History Narrative  . Not on file    Past Surgical Hx:   Past Surgical History:  Procedure Laterality Date  . BRACHIOPLASTY     bilateral  UE   . CATARACT EXTRACTION     15 years ago   . KNEE SURGERY Right    meniscus tear repair   . LENS MATERIAL REMOVAL    . ROBOTIC ASSISTED TOTAL HYSTERECTOMY WITH BILATERAL SALPINGO OOPHERECTOMY Bilateral 07/11/2017   Procedure: XI ROBOTIC ASSISTED TOTAL HYSTERECTOMY WITH BILATERAL SALPINGO OOPHORECTOMY WITH SENTINAL LYMPH NODES;  Surgeon: Everitt Amber, MD;  Location: [facility];  Service: Gynecology;  Laterality: Bilateral;    Past Medical Hx:  Past Medical History:  Diagnosis Date  . Cancer [facility])    endometrial cancer   . Complication of anesthesia    about 15 years ago , reports BP dropepd very low at that time   . Hypothyroidism   . Syncope    2 weeks ago ; was taking an antidepressant and wine together; did not get instructuons not to   . Thyroid disease     Past Gynecological History:  G0 No LMP recorded. Patient is postmenopausal.  Family Hx:  Family History  Problem Relation Age of Onset  . Diabetes Mother   . Heart failure Father   . Breast cancer Maternal Aunt   . Cancer Maternal Aunt  Review of Systems:  Constitutional  Feels well,    ENT Normal appearing ears and nares bilaterally Skin/Breast  No rash, sores, jaundice, itching, dryness Cardiovascular  No chest pain, shortness of breath, or edema  Pulmonary  No cough or wheeze.  Gastro Intestinal  No nausea, vomitting, or diarrhoea. No bright red blood per rectum, no abdominal pain, change in bowel movement, or constipation.  Genito Urinary  No frequency, urgency, dysuria, no bleeding Musculo Skeletal  No myalgia, arthralgia, joint swelling or pain  Neurologic  No weakness, numbness, change in gait,  Psychology  No depression, anxiety, insomnia.   Vitals:  Blood pressure (!) 103/41, pulse 63, temperature 98.5 F (36.9 C), temperature source Oral, resp. rate 16, height 5' 2.5" (1.588 m), weight 137 lb 2 oz (62.2  kg), SpO2 100 %.  Physical Exam: WD in NAD Neck  Supple NROM, without any enlargements.  Lymph Node Survey No cervical supraclavicular or inguinal adenopathy Cardiovascular  Pulse normal rate, regularity and rhythm. S1 and S2 normal.  Lungs  Clear to auscultation bilateraly, without wheezes/crackles/rhonchi. Good air movement.  Skin  No rash/lesions/breakdown  Psychiatry  Alert and oriented to person, place, and time  Abdomen  Normoactive bowel sounds, abdomen soft, non-tender and thin without evidence of hernia. Incisions in tact. Back No CVA tenderness Genito Urinary: well healed vaginal cuff. No lesions or masses. No bleeding. Rectal  deferred Extremities  No bilateral cyanosis, clubbing or edema.   30 minutes of direct face to face counseling time was spent with the patient. This included discussion about prognosis, therapy recommendations and postoperative side effects and are beyond the scope of routine postoperative care.   Donaciano Eva, MD  08/02/2017, 12:45 PM

## 2017-08-14 ENCOUNTER — Ambulatory Visit: Payer: Medicare Other | Admitting: Gynecologic Oncology

## 2017-11-08 ENCOUNTER — Telehealth: Payer: Self-pay | Admitting: *Deleted

## 2017-11-08 NOTE — Telephone Encounter (Signed)
Returned the patient's call and gave her the appt for May 8th with Dr. Denman George

## 2018-01-03 ENCOUNTER — Inpatient Hospital Stay: Payer: Medicare Other

## 2018-01-03 ENCOUNTER — Inpatient Hospital Stay: Payer: Medicare Other | Attending: Gynecologic Oncology | Admitting: Gynecologic Oncology

## 2018-01-03 ENCOUNTER — Encounter: Payer: Self-pay | Admitting: Gynecologic Oncology

## 2018-01-03 VITALS — BP 114/68 | HR 60 | Temp 98.3°F | Resp 20 | Ht 62.5 in | Wt 137.2 lb

## 2018-01-03 DIAGNOSIS — Z90710 Acquired absence of both cervix and uterus: Secondary | ICD-10-CM | POA: Diagnosis not present

## 2018-01-03 DIAGNOSIS — Z8542 Personal history of malignant neoplasm of other parts of uterus: Secondary | ICD-10-CM

## 2018-01-03 DIAGNOSIS — Z08 Encounter for follow-up examination after completed treatment for malignant neoplasm: Secondary | ICD-10-CM | POA: Insufficient documentation

## 2018-01-03 DIAGNOSIS — Z90722 Acquired absence of ovaries, bilateral: Secondary | ICD-10-CM | POA: Insufficient documentation

## 2018-01-03 DIAGNOSIS — C541 Malignant neoplasm of endometrium: Secondary | ICD-10-CM

## 2018-01-03 DIAGNOSIS — R140 Abdominal distension (gaseous): Secondary | ICD-10-CM

## 2018-01-03 DIAGNOSIS — K5900 Constipation, unspecified: Secondary | ICD-10-CM

## 2018-01-03 LAB — BASIC METABOLIC PANEL
ANION GAP: 5 (ref 3–11)
BUN: 18 mg/dL (ref 7–26)
CHLORIDE: 103 mmol/L (ref 98–109)
CO2: 32 mmol/L — ABNORMAL HIGH (ref 22–29)
Calcium: 9.6 mg/dL (ref 8.4–10.4)
Creatinine, Ser: 0.76 mg/dL (ref 0.60–1.10)
GFR calc Af Amer: >60 mL/min (ref >60)
GLUCOSE: 95 mg/dL (ref 70–140)
POTASSIUM: 3.3 mmol/L — AB (ref 3.5–5.1)
Sodium: 140 mmol/L (ref 136–145)

## 2018-01-03 NOTE — Patient Instructions (Signed)
Please notify Dr Denman George at phone number [phone] if you notice vaginal bleeding, new pelvic or abdominal pains, bloating, feeling full easy, or a change in bladder or bowel function.   Dr Denman George has ordered a CT scan to evaluate your abdominal bloating.  Dr Denman George will see you again in May, 2019.

## 2018-01-03 NOTE — Progress Notes (Signed)
Follow-up Note: Gyn-Onc  Consult was requested by Dr. Durene Fruits for the evaluation of Carol Lambert 77 y.o. female  CC:  Chief Complaint  Patient presents with  . Endometrial cancer [facility])    Assessment/Plan:  [name]  is a 77 y.o.  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 No residual carcinoma in specimen therefore no adjuvant therapy was recommended.   She has no evidence of disease on exam. Abdominal distension - likely delayed transit of stool contributing to gas. Will evaluate for recurrence with CT abd/pelvis.  Will see back for surveillance in 6 months.   HPI: Carol Lambert is a 77 year old P0 who is seen in consultation at the request of [name] with high grade endometrial cancer.  The patient has a history of postmenopausal bleeding since September, 2018. She reported this to her OBGYN who performed a TVUS on 05/03/17 which showed a 7.9cm uterus (retroverted) with an 82m thickened endometrium.  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   She stopped bleeding after the procedure.  Interval Hx: On 07/11/17 she underwent robotic assisted total hysterectomy BSO pelvic and para-aortic sentinel lymph node biopsy.  Surgery was uncomplicated.  Postoperative pathology revealed a microscopic focus suspicious for residual carcinoma in the endometrium but otherwise no measurable foci of residual carcinoma in the hysterectomy specimen.  8 sentinel lymph nodes were negative for metastatic disease as well as the cervix, tubes and ovaries.  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 MSI testing could not be performed on the tumor because there was inadequate  malignancy. She does not have a family history concerning for Lynch syndrome.  Since surgery she has experienced increased abdominal distension and constipation. She has seen GI.   Current Meds:  Outpatient Encounter Medications as of 01/03/2018  Medication Sig  . Calcium Carb-Cholecalciferol (CALCIUM/VITAMIN D PO) Take 1 tablet daily by mouth.  .Marland Kitchenibuprofen (ADVIL,MOTRIN) 800 MG tablet Take 1 tablet (800 mg total) every 8 (eight) hours as needed by mouth (mild pain).  .Marland Kitchenlevothyroxine (SYNTHROID, LEVOTHROID) 100 MCG tablet Take 100 mcg by mouth daily before breakfast.  . polyethylene glycol (MIRALAX / GLYCOLAX) packet Take 17 g daily by mouth.  . Polyvinyl Alcohol-Povidone (REFRESH OP) Apply 1 drop daily as needed to eye (dry eyes).  . Pseudoephedrine HCl (SUDAFED 24 HOUR PO) Take 1 tablet daily as needed by mouth (Sinus Pressure).  . triamterene-hydrochlorothiazide (MAXZIDE-25) 37.5-25 MG tablet Take 0.5 tablets every evening by mouth.  . [DISCONTINUED] senna-docusate (SENOKOT-S) 8.6-50 MG tablet Take 2 tablets at bedtime by mouth. (Patient not taking: Reported on 01/03/2018)   No facility-administered encounter medications on file as of 01/03/2018.     Allergy: No Known Allergies  Social Hx:   Social History   Socioeconomic History  . Marital status: Married    Spouse name: Not on file  . Number of children: Not on file  . Years of education: Not on file  . Highest education level: Not on file  Occupational History  . Not on file  Social Needs  . Financial resource strain: Not on file  . Food insecurity:    Worry: Not on file    Inability: Not on file  . Transportation  needs:    Medical: Not on file    Non-medical: Not on file  Tobacco Use  . Smoking status: Never Smoker  . Smokeless tobacco: Never Used  Substance and Sexual Activity  . Alcohol use: Yes    Alcohol/week: 4.2 oz    Types: 7 Glasses of wine per week  . Drug use: No  . Sexual activity: Not on file  Lifestyle   . Physical activity:    Days per week: Not on file    Minutes per session: Not on file  . Stress: Not on file  Relationships  . Social connections:    Talks on phone: Not on file    Gets together: Not on file    Attends religious service: Not on file    Active member of club or organization: Not on file    Attends meetings of clubs or organizations: Not on file    Relationship status: Not on file  . Intimate partner violence:    Fear of current or ex partner: Not on file    Emotionally abused: Not on file    Physically abused: Not on file    Forced sexual activity: Not on file  Other Topics Concern  . Not on file  Social History Narrative  . Not on file    Past Surgical Hx:  Past Surgical History:  Procedure Laterality Date  . BRACHIOPLASTY     bilateral  UE   . CATARACT EXTRACTION     15 years ago   . KNEE SURGERY Right    meniscus tear repair   . LENS MATERIAL REMOVAL    . ROBOTIC ASSISTED TOTAL HYSTERECTOMY WITH BILATERAL SALPINGO OOPHERECTOMY Bilateral 07/11/2017   Procedure: XI ROBOTIC ASSISTED TOTAL HYSTERECTOMY WITH BILATERAL SALPINGO OOPHORECTOMY WITH SENTINAL LYMPH NODES;  Surgeon: Everitt Amber, MD;  Location: [facility];  Service: Gynecology;  Laterality: Bilateral;    Past Medical Hx:  Past Medical History:  Diagnosis Date  . Cancer [facility])    endometrial cancer   . Complication of anesthesia    about 15 years ago , reports BP dropepd very low at that time   . Hypothyroidism   . Syncope    2 weeks ago ; was taking an antidepressant and wine together; did not get instructuons not to   . Thyroid disease     Past Gynecological History:  G0 No LMP recorded. Patient is postmenopausal.  Family Hx:  Family History  Problem Relation Age of Onset  . Diabetes Mother   . Heart failure Father   . Breast cancer Maternal Aunt   . Cancer Maternal Aunt     Review of Systems:  Constitutional  Feels well,    ENT Normal appearing ears and nares  bilaterally Skin/Breast  No rash, sores, jaundice, itching, dryness Cardiovascular  No chest pain, shortness of breath, or edema  Pulmonary  No cough or wheeze.  Gastro Intestinal  No nausea, vomitting, or diarrhoea. No bright red blood per rectum, no abdominal pain, change in bowel movement, or constipation.  Genito Urinary  No frequency, urgency, dysuria, no bleeding Musculo Skeletal  No myalgia, arthralgia, joint swelling or pain  Neurologic  No weakness, numbness, change in gait,  Psychology  No depression, anxiety, insomnia.   Vitals:  Blood pressure 114/68, pulse 60, temperature 98.3 F (36.8 C), temperature source Oral, resp. rate 20, height 5' 2.5" (1.588 m), weight 137 lb 3.2 oz (62.2 kg), SpO2 100 %.  Physical Exam: WD in NAD Neck  Supple NROM, without any enlargements.  Lymph Node Survey No cervical supraclavicular or inguinal adenopathy Cardiovascular  Pulse normal rate, regularity and rhythm. S1 and S2 normal.  Lungs  Clear to auscultation bilateraly, without wheezes/crackles/rhonchi. Good air movement.  Skin  No rash/lesions/breakdown  Psychiatry  Alert and oriented to person, place, and time  Abdomen  Normoactive bowel sounds, abdomen soft, non-tender and thin without evidence of hernia. Incisions soft and healed. No palpable abdominal masses.  Back No CVA tenderness Genito Urinary: well healed vaginal cuff. No lesions or masses. No bleeding. Rectal  deferred Extremities  No bilateral cyanosis, clubbing or edema.   Thereasa Solo, MD  01/03/2018, 1:57 PM

## 2018-01-08 ENCOUNTER — Ambulatory Visit (HOSPITAL_COMMUNITY)
Admission: RE | Admit: 2018-01-08 | Discharge: 2018-01-08 | Disposition: A | Discharge status: Home / Self Care | Payer: Medicare Other | Source: Ambulatory Visit | Attending: Gynecologic Oncology | Admitting: Gynecologic Oncology

## 2018-01-08 ENCOUNTER — Encounter (HOSPITAL_COMMUNITY): Payer: Self-pay

## 2018-01-08 DIAGNOSIS — R140 Abdominal distension (gaseous): Secondary | ICD-10-CM | POA: Diagnosis present

## 2018-01-08 DIAGNOSIS — K8020 Calculus of gallbladder without cholecystitis without obstruction: Secondary | ICD-10-CM | POA: Insufficient documentation

## 2018-01-08 DIAGNOSIS — I898 Other specified noninfective disorders of lymphatic vessels and lymph nodes: Secondary | ICD-10-CM | POA: Diagnosis not present

## 2018-01-08 MED ORDER — IOHEXOL 300 MG/ML  SOLN
100.0000 mL | Freq: Once | INTRAMUSCULAR | Status: AC | PRN
  Administered 2018-01-08: 100 mL via INTRAVENOUS

## 2018-01-09 ENCOUNTER — Telehealth: Payer: Self-pay

## 2018-01-09 NOTE — Telephone Encounter (Signed)
Per Joylene John NP:  Call pt with results of Ct Abd/ Pelvis, no evidence of recurrent or metastatic carcinoma,  no gallstones but no inflammation.  Outgoing call to pt regarding these results, she voiced understanding. Let her know she could speak further with her PCP regarding gallstones and pt indicated that she felt like that was a good idea.  Let her know the report has been released on mychart as well.  No needs per pt at this time.

## 2018-07-16 ENCOUNTER — Inpatient Hospital Stay: Payer: Medicare Other | Attending: Gynecologic Oncology | Admitting: Gynecologic Oncology

## 2018-07-16 ENCOUNTER — Encounter: Payer: Self-pay | Admitting: Gynecologic Oncology

## 2018-07-16 VITALS — BP 97/77 | HR 56 | Temp 97.7°F | Ht 62.5 in

## 2018-07-16 DIAGNOSIS — C541 Malignant neoplasm of endometrium: Secondary | ICD-10-CM | POA: Insufficient documentation

## 2018-07-16 DIAGNOSIS — R140 Abdominal distension (gaseous): Secondary | ICD-10-CM

## 2018-07-16 DIAGNOSIS — Z90722 Acquired absence of ovaries, bilateral: Secondary | ICD-10-CM

## 2018-07-16 DIAGNOSIS — Z90710 Acquired absence of both cervix and uterus: Secondary | ICD-10-CM | POA: Diagnosis not present

## 2018-07-16 NOTE — Progress Notes (Signed)
Follow-up Note: Gyn-Onc  Consult was requested by Dr. Durene Fruits for the evaluation of Diaz 77 y.o. female  CC:  Chief Complaint  Patient presents with  . Endometrial cancer [facility])    Assessment/Plan:  Ms. Jaynie Hitch  is a 77 y.o.  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 No residual carcinoma in specimen therefore no adjuvant therapy was recommended.   She has no evidence of disease on exam. Abdominal distension - likely delayed transit of stool contributing to gas. Will evaluate for recurrence with CT abd/pelvis.  Will see back for surveillance in 6 months.   HPI: Nakima Fluegge is a 77 year old P0 who is seen in consultation at the request of [name] with high grade endometrial cancer.  The patient has a history of postmenopausal bleeding since September, 2018. She reported this to her OBGYN who performed a TVUS on 05/03/17 which showed a 7.9cm uterus (retroverted) with an 70m thickened endometrium.  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   She stopped bleeding after the procedure.  On 07/11/17 she underwent robotic assisted total hysterectomy BSO pelvic and para-aortic sentinel lymph node biopsy.  Surgery was uncomplicated.  Postoperative pathology revealed a microscopic focus suspicious for residual carcinoma in the endometrium but otherwise no measurable foci of residual carcinoma in the hysterectomy specimen.  8 sentinel lymph nodes were negative for metastatic disease as well as the cervix, tubes and ovaries.  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 MSI testing could not be performed on the tumor because there was inadequate malignancy. She  does not have a family history concerning for Lynch syndrome.  Interval Hx: Since surgery she has been doing well and has no symptoms of recurrence.   Current Meds:  Outpatient Encounter Medications as of 07/16/2018  Medication Sig  . Calcium Carb-Cholecalciferol (CALCIUM/VITAMIN D PO) Take 1 tablet daily by mouth.  .Marland Kitchenibuprofen (ADVIL,MOTRIN) 800 MG tablet Take 1 tablet (800 mg total) every 8 (eight) hours as needed by mouth (mild pain).  .Marland Kitchenlevothyroxine (SYNTHROID, LEVOTHROID) 100 MCG tablet Take 100 mcg by mouth daily before breakfast.  . polyethylene glycol (MIRALAX / GLYCOLAX) packet Take 17 g daily by mouth.  . Polyvinyl Alcohol-Povidone (REFRESH OP) Apply 1 drop daily as needed to eye (dry eyes).  . Pseudoephedrine HCl (SUDAFED 24 HOUR PO) Take 1 tablet daily as needed by mouth (Sinus Pressure).  . triamterene-hydrochlorothiazide (MAXZIDE-25) 37.5-25 MG tablet Take 0.5 tablets every evening by mouth.   No facility-administered encounter medications on file as of 07/16/2018.     Allergy: No Known Allergies  Social Hx:   Social History   Socioeconomic History  . Marital status: Married    Spouse name: Not on file  . Number of children: Not on file  . Years of education: Not on file  . Highest education level: Not on file  Occupational History  . Not on file  Social Needs  . Financial resource strain: Not on file  . Food insecurity:    Worry: Not on file    Inability: Not on file  . Transportation needs:    Medical: Not on file    Non-medical: Not on file  Tobacco Use  . Smoking status:  Never Smoker  . Smokeless tobacco: Never Used  Substance and Sexual Activity  . Alcohol use: Yes    Alcohol/week: 7.0 standard drinks    Types: 7 Glasses of wine per week  . Drug use: No  . Sexual activity: Not on file  Lifestyle  . Physical activity:    Days per week: Not on file    Minutes per session: Not on file  . Stress: Not on file  Relationships  . Social connections:     Talks on phone: Not on file    Gets together: Not on file    Attends religious service: Not on file    Active member of club or organization: Not on file    Attends meetings of clubs or organizations: Not on file    Relationship status: Not on file  . Intimate partner violence:    Fear of current or ex partner: Not on file    Emotionally abused: Not on file    Physically abused: Not on file    Forced sexual activity: Not on file  Other Topics Concern  . Not on file  Social History Narrative  . Not on file    Past Surgical Hx:  Past Surgical History:  Procedure Laterality Date  . BRACHIOPLASTY     bilateral  UE   . CATARACT EXTRACTION     15 years ago   . KNEE SURGERY Right    meniscus tear repair   . LENS MATERIAL REMOVAL    . ROBOTIC ASSISTED TOTAL HYSTERECTOMY WITH BILATERAL SALPINGO OOPHERECTOMY Bilateral 07/11/2017   Procedure: XI ROBOTIC ASSISTED TOTAL HYSTERECTOMY WITH BILATERAL SALPINGO OOPHORECTOMY WITH SENTINAL LYMPH NODES;  Surgeon: Everitt Amber, MD;  Location: [facility];  Service: Gynecology;  Laterality: Bilateral;    Past Medical Hx:  Past Medical History:  Diagnosis Date  . Cancer [facility])    endometrial cancer   . Complication of anesthesia    about 15 years ago , reports BP dropepd very low at that time   . Hypothyroidism   . Syncope    2 weeks ago ; was taking an antidepressant and wine together; did not get instructuons not to   . Thyroid disease     Past Gynecological History:  G0 No LMP recorded. Patient is postmenopausal.  Family Hx:  Family History  Problem Relation Age of Onset  . Diabetes Mother   . Heart failure Father   . Breast cancer Maternal Aunt   . Cancer Maternal Aunt     Review of Systems:  Constitutional  Feels well,    ENT Normal appearing ears and nares bilaterally Skin/Breast  No rash, sores, jaundice, itching, dryness Cardiovascular  No chest pain, shortness of breath, or edema  Pulmonary  No cough or wheeze.   Gastro Intestinal  No nausea, vomitting, or diarrhoea. No bright red blood per rectum, no abdominal pain, change in bowel movement, or constipation.  Genito Urinary  No frequency, urgency, dysuria, no bleeding Musculo Skeletal  No myalgia, arthralgia, joint swelling or pain  Neurologic  No weakness, numbness, change in gait,  Psychology  No depression, anxiety, insomnia.   Vitals:  Blood pressure 97/77, pulse (!) 56, temperature 97.7 F (36.5 C), temperature source Oral, height 5' 2.5" (1.588 m), SpO2 99 %.  Physical Exam: WD in NAD Neck  Supple NROM, without any enlargements.  Lymph Node Survey No cervical supraclavicular or inguinal adenopathy Cardiovascular  Pulse normal rate, regularity and rhythm. S1 and S2 normal.  Lungs  Clear to auscultation bilateraly, without wheezes/crackles/rhonchi. Good air movement.  Skin  No rash/lesions/breakdown  Psychiatry  Alert and oriented to person, place, and time  Abdomen  Normoactive bowel sounds, abdomen soft, non-tender and thin without evidence of hernia. Incisions soft and healed. No palpable abdominal masses.  Back No CVA tenderness Genito Urinary: well healed vaginal cuff. No lesions or masses. No bleeding. Rectal  deferred Extremities  No bilateral cyanosis, clubbing or edema.   Thereasa Solo, MD  07/16/2018, 1:46 PM

## 2018-07-16 NOTE — Patient Instructions (Signed)
Please notify Dr Denman George at phone number [phone] if you notice vaginal bleeding, new pelvic or abdominal pains, bloating, feeling full easy, or a change in bladder or bowel function.   Please return to see Dr Denman George in 6 months (May, 2020).

## 2018-07-21 IMAGING — CT CT ABD-PELV W/ CM
2 of 5 series · 16 of 46 positions shown, 18 images · IV contrast (omnipaque)
Comparison: 06/14/2017

CLINICAL DATA: Abdominal distention. Constipation. Status post
TAH-BSO for high-grade endometrial carcinoma.

EXAM:
CT ABDOMEN AND PELVIS WITH CONTRAST
TECHNIQUE: Multidetector CT imaging of the abdomen and pelvis was performed
using the standard protocol following bolus administration of
intravenous contrast.
CONTRAST:  100mL OMNIPAQUE IOHEXOL 300 MG/ML  SOLN

[Series 2: axial st · axial · 0.72mm/px · z∈[-769,-404]mm · 13 of 87 slices shown, 15 images]
[im 7/87  soft-tissue]
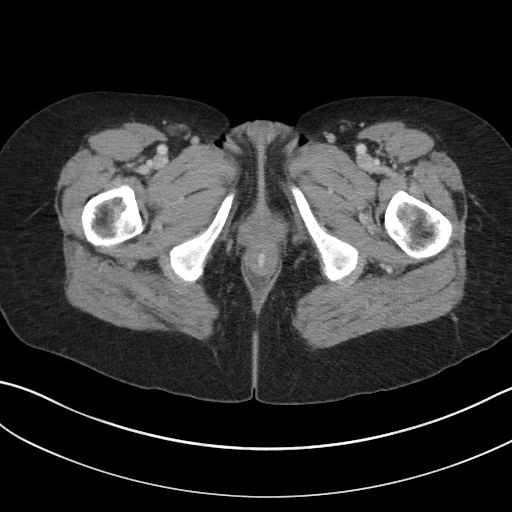
[im 7/87  bone]
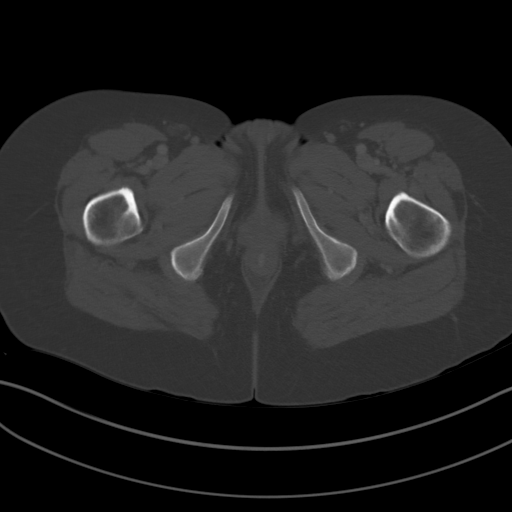
[im 13/87  soft-tissue]
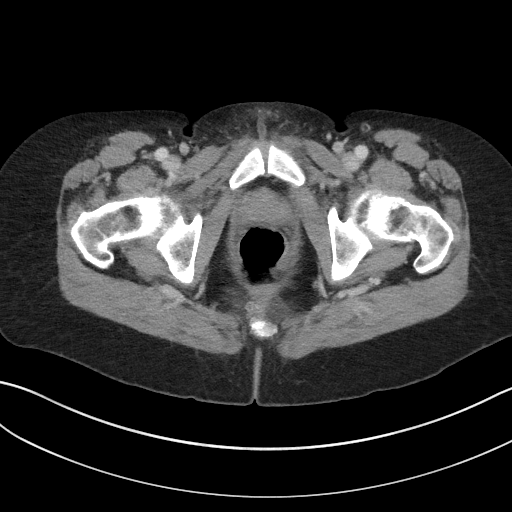
[im 19/87  soft-tissue]
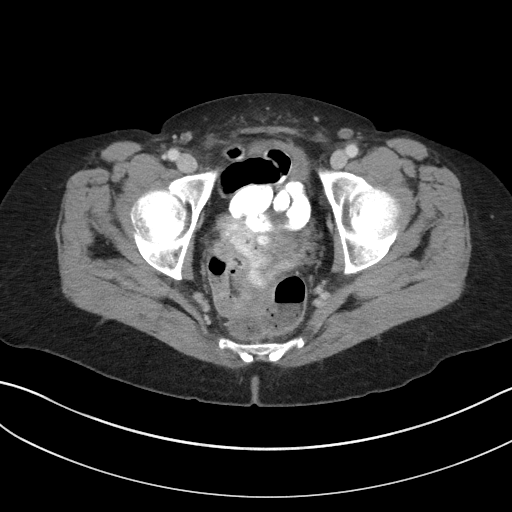
[im 25/87  soft-tissue]
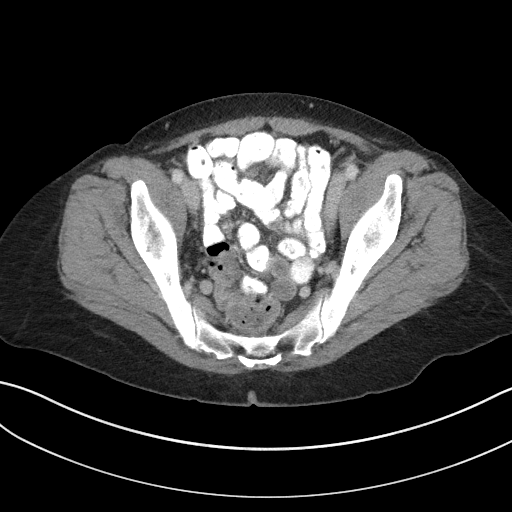
[im 31/87  soft-tissue]
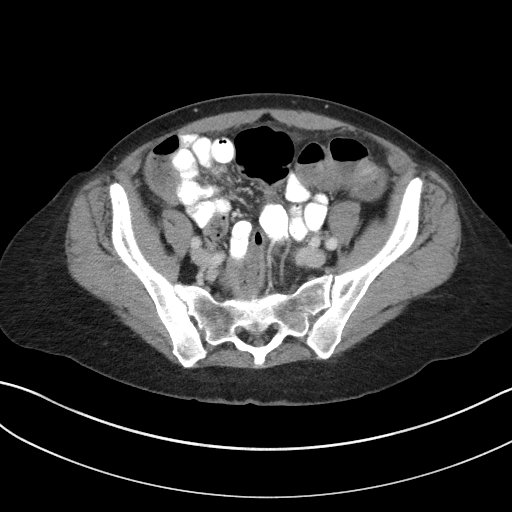
[im 37/87  soft-tissue]
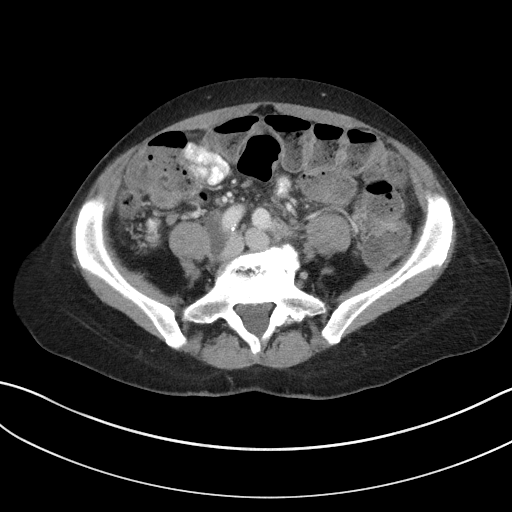
[im 44/87  soft-tissue]
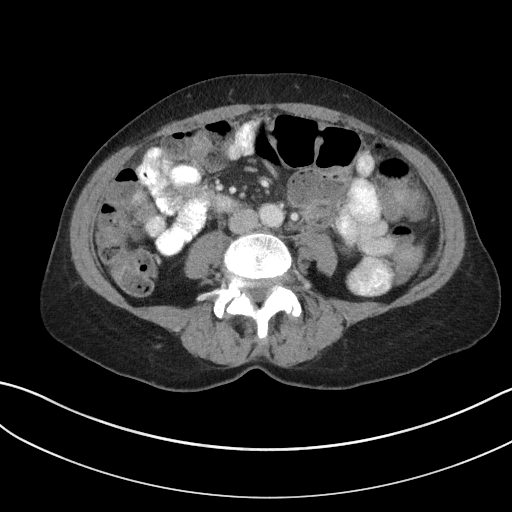
[im 50/87  soft-tissue]
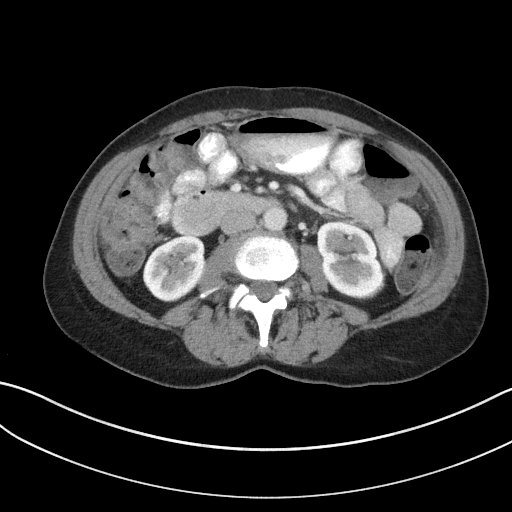
[im 56/87  soft-tissue]
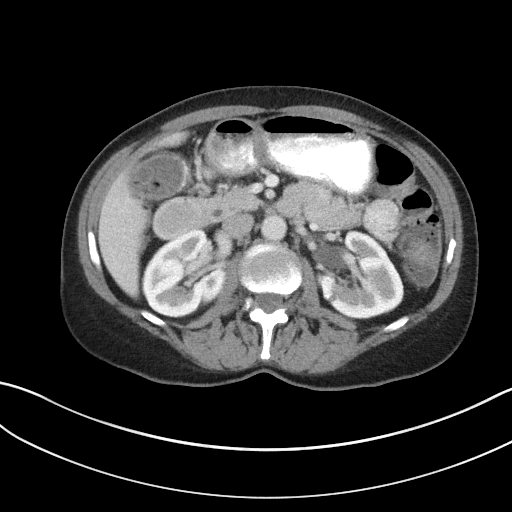
[im 56/87  bone]
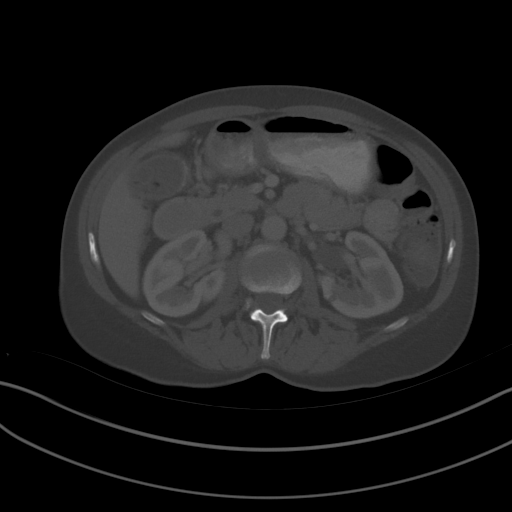
[im 62/87  soft-tissue]
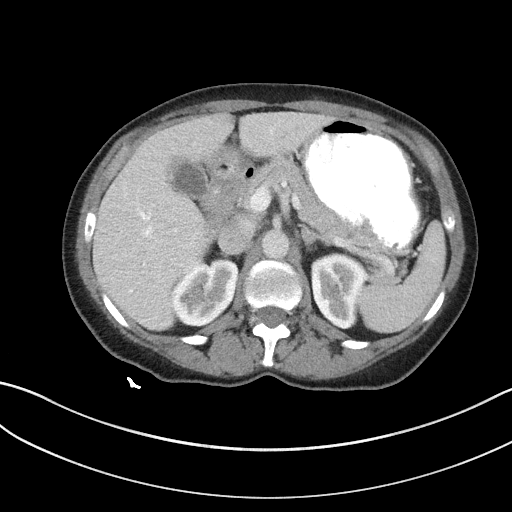
[im 68/87  soft-tissue]
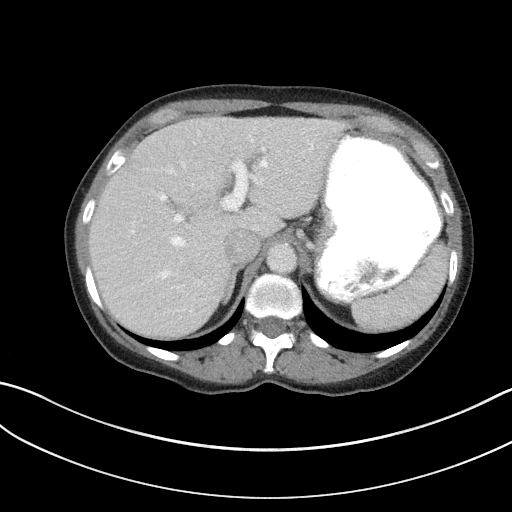
[im 74/87  soft-tissue]
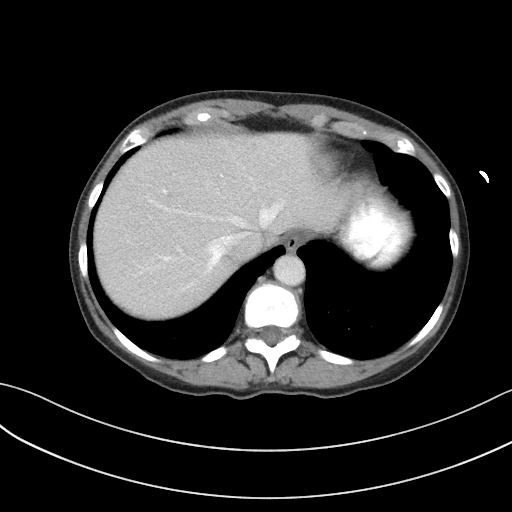
[im 80/87  soft-tissue]
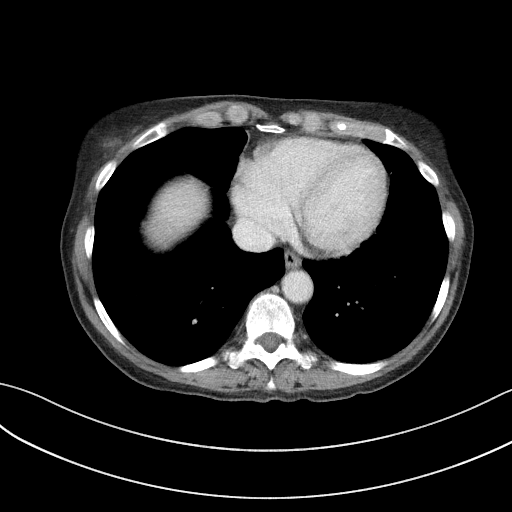

[Series 5: coronal st · coronal · 0.82mm/px · 3 of 83 slices shown]
[im 28/83  soft-tissue]
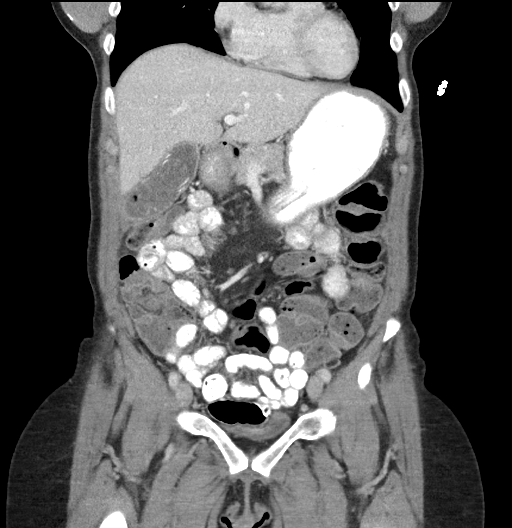
[im 37/83  soft-tissue]
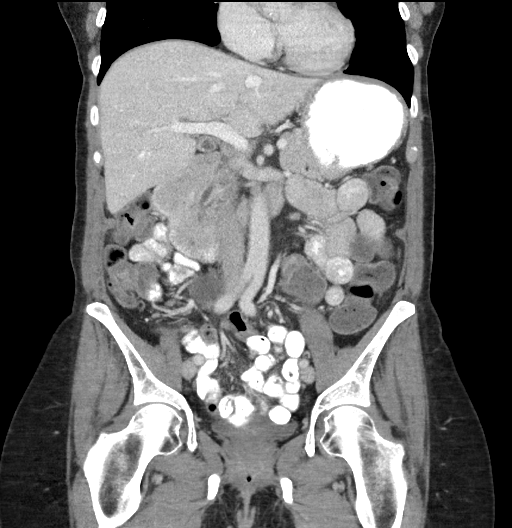
[im 46/83  soft-tissue]
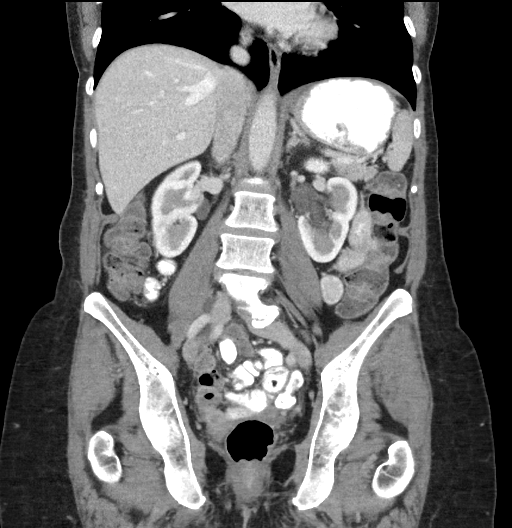

[16 of 46 positions shown; findings below may reference images not displayed]

FINDINGS: Lower Chest: No acute findings.

Hepatobiliary: No hepatic masses identified. Gallstones are seen,
however there is no evidence of cholecystitis or biliary dilatation.

Pancreas:  No mass or inflammatory changes.

Spleen: Within normal limits in size and appearance.

Adrenals/Urinary Tract: No masses identified. Stable tiny sub-cm
low-attenuation lesions in both kidneys are stable and most likely
represent tiny cysts. No evidence of hydronephrosis.

Stomach/Bowel: No evidence of obstruction, inflammatory process or
abnormal fluid collections. Normal appendix visualized. Moderate
colonic stool burden noted.

Vascular/Lymphatic: No pathologically enlarged lymph nodes. No
abdominal aortic aneurysm.

Reproductive: Patient has undergone TAH-BSO since prior study. No
adnexal masses or ascites visualized. A small simple appearing
retroperitoneal fluid collection is seen between the right common
iliac vessels and psoas muscle, measuring 3.3 x 2.1 cm. This is
consistent with a postop lymphocele.

Other:  None.

Musculoskeletal:  No suspicious bone lesions identified.
IMPRESSION: Interval TAH-BSO, with small postop lymphocele along the right
common iliac chain.

No evidence of recurrent or metastatic carcinoma, or other acute
findings.

Cholelithiasis.  No radiographic evidence of cholecystitis.

## 2018-10-04 ENCOUNTER — Telehealth: Payer: Self-pay | Admitting: *Deleted

## 2018-10-04 NOTE — Telephone Encounter (Signed)
Patient called and asked "I am moving to [place], [place]. I don't know how to go about transferring my care." Explained to the patient that we will talk to Dr. Denman George and then fax a referral with her records to that office. Notified Dr. Denman George

## 2018-10-12 ENCOUNTER — Telehealth: Payer: Self-pay | Admitting: *Deleted

## 2018-10-12 NOTE — Telephone Encounter (Signed)
Returned the patient's call regarding transferring her care. Per Dr. Denman George "I don't know anyone there, but if you can't fine a gyn oncologist you can see a regular gyn." Patient asked about her records, explained to the patient that the new office can fax over a release form and we will send the records. Patient given the fax number

## 2019-01-02 ENCOUNTER — Telehealth: Payer: Self-pay | Admitting: *Deleted

## 2019-01-02 NOTE — Telephone Encounter (Signed)
Per request from [place] I have faxed records

## 2019-01-15 ENCOUNTER — Telehealth: Payer: Self-pay | Admitting: *Deleted

## 2019-01-15 NOTE — Telephone Encounter (Signed)
Patient called and left a message to cancel her appt on Friday. Patient has moved

## 2019-01-18 ENCOUNTER — Ambulatory Visit: Payer: Medicare Other | Admitting: Gynecologic Oncology

## 2022-06-24 ENCOUNTER — Telehealth: Payer: Self-pay | Admitting: Family

## 2022-06-24 ENCOUNTER — Other Ambulatory Visit: Payer: Self-pay | Admitting: Family

## 2022-06-24 ENCOUNTER — Ambulatory Visit (INDEPENDENT_AMBULATORY_CARE_PROVIDER_SITE_OTHER): Payer: Medicare Other | Admitting: Family

## 2022-06-24 ENCOUNTER — Encounter: Payer: Self-pay | Admitting: Family

## 2022-06-24 ENCOUNTER — Ambulatory Visit: Payer: Medicare Other | Admitting: Family

## 2022-06-24 VITALS — BP 108/60 | HR 60 | Temp 97.6°F | Ht 62.0 in | Wt 137.0 lb

## 2022-06-24 DIAGNOSIS — E559 Vitamin D deficiency, unspecified: Secondary | ICD-10-CM

## 2022-06-24 DIAGNOSIS — R0981 Nasal congestion: Secondary | ICD-10-CM | POA: Diagnosis not present

## 2022-06-24 DIAGNOSIS — I10 Essential (primary) hypertension: Secondary | ICD-10-CM | POA: Diagnosis not present

## 2022-06-24 DIAGNOSIS — E039 Hypothyroidism, unspecified: Secondary | ICD-10-CM | POA: Diagnosis not present

## 2022-06-24 DIAGNOSIS — Z13220 Encounter for screening for lipoid disorders: Secondary | ICD-10-CM | POA: Diagnosis not present

## 2022-06-24 MED ORDER — TRIAMTERENE-HCTZ 37.5-25 MG PO TABS: 1.0000 | ORAL_TABLET | Freq: Every day | ORAL | 1 refills | Status: DC

## 2022-06-24 NOTE — Patient Instructions (Addendum)
Please call back with the name and dosage of your blood pressure medication;  Please schedule a fasting lab appointment;   We will get you referred to ENT once your new insurance;

## 2022-06-24 NOTE — Telephone Encounter (Signed)
Noted.   FYI to provider  

## 2022-06-24 NOTE — Telephone Encounter (Signed)
Pt stated she was advised to let us know she takes triamterene hctz 37.5-25 mg.

## 2022-06-24 NOTE — Progress Notes (Signed)
Carol Lambert is a 81 y.o. female with the following history as recorded in EpicCare:  Patient Active Problem List   Diagnosis Date Noted   Endometrial cancer ([place]) 06/09/2017   Hearing loss 06/09/2017   Essential hypertension 04/21/2015    Current Outpatient Medications  Medication Sig Dispense Refill   Calcium Carb-Cholecalciferol (CALCIUM/VITAMIN D PO) Take 1 tablet daily by mouth.     ibuprofen (ADVIL,MOTRIN) 800 MG tablet Take 1 tablet (800 mg total) every 8 (eight) hours as needed by mouth (mild pain). 30 tablet 0   levothyroxine (SYNTHROID, LEVOTHROID) 100 MCG tablet Take 100 mcg by mouth daily before breakfast.     No current facility-administered medications for this visit.    Allergies: Patient has no known allergies.  Past Medical History:  Diagnosis Date   Cancer [facility])    endometrial cancer    Complication of anesthesia    about 15 years ago , reports BP dropepd very low at that time    Hypothyroidism    Syncope    2 weeks ago ; was taking an antidepressant and wine together; did not get instructuons not to    Thyroid disease     Past Surgical History:  Procedure Laterality Date   BRACHIOPLASTY     bilateral  UE    CATARACT EXTRACTION     15 years ago    KNEE SURGERY Right    meniscus tear repair    LENS MATERIAL REMOVAL     ROBOTIC ASSISTED TOTAL HYSTERECTOMY WITH BILATERAL SALPINGO OOPHERECTOMY Bilateral 07/11/2017   Procedure: XI ROBOTIC ASSISTED TOTAL HYSTERECTOMY WITH BILATERAL SALPINGO OOPHORECTOMY WITH SENTINAL LYMPH NODES;  Surgeon: Everitt Amber, MD;  Location: [facility];  Service: Gynecology;  Laterality: Bilateral;    Family History  Problem Relation Age of Onset   Diabetes Mother    Heart failure Father    Breast cancer Maternal Aunt    Cancer Maternal Aunt     Social History   Tobacco Use   Smoking status: Never   Smokeless tobacco: Never  Substance Use Topics   Alcohol use: Yes    Alcohol/week: 7.0 standard drinks of alcohol    Types: 7  Glasses of wine per week    Subjective:   Presents today as a new patient; has moved back to the area of [place]; no acute concerns today except for increased nasal congestion x 6 months; thinks it may have started after having COVID type illness; no relief with OTC Flonase or Astelin; would like to see ENT at later date once new insurance is in place;   Does take fluid pill but unsure of name/ dosage and will call back with that information;  Prefers to return for fasting labs at later date;  Defers any vaccines at this time;     Objective:  Vitals:   06/24/22 1009  BP: 108/60  Pulse: 60  Temp: 97.6 F (36.4 C)  TempSrc: Oral  SpO2: 98%  Weight: 137 lb (62.1 kg)  Height: _0  (1.575 m)    General: Well developed, well nourished, in no acute distress  Skin : Warm and dry.  Head: Normocephalic and atraumatic  Eyes: Sclera and conjunctiva clear; pupils round and reactive to light; extraocular movements intact  Ears: External normal; canals clear; tympanic membranes normal  Oropharynx: Pink, supple. No suspicious lesions  Neck: Supple without thyromegaly, adenopathy  Lungs: Respirations unlabored; clear to auscultation bilaterally without wheeze, rales, rhonchi  CVS exam: normal rate and regular rhythm.  Neurologic:  Alert and oriented; speech intact; face symmetrical; moves all extremities well; CNII-XII intact without focal deficit   Assessment: 1. Hypothyroidism, unspecified type   2. Essential hypertension   3. Nasal congestion   4. Lipid screening   5. Vitamin D deficiency     Plan:  Patient will return for fasting labs at her convenience once new insurance is in place;  She will call back with name of blood pressure medication and dosage so that medication can be updated; Will plan to refer to ENT once new insurance is in place;   Patient defers vaccines today;   No follow-ups on file.  Orders Placed This Encounter  Procedures   CBC with Differential/Platelet     Standing Status:   Future    Standing Expiration Date:   06/25/2023   Comp Met (CMET)    Standing Status:   Future    Standing Expiration Date:   06/25/2023   Lipid panel    Standing Status:   Future    Standing Expiration Date:   06/25/2023   TSH    Standing Status:   Future    Standing Expiration Date:   06/25/2023   Vitamin D (25 hydroxy)    Standing Status:   Future    Standing Expiration Date:   06/25/2023    Requested Prescriptions    No prescriptions requested or ordered in this encounter

## 2022-07-07 ENCOUNTER — Telehealth: Payer: Self-pay

## 2022-07-07 ENCOUNTER — Ambulatory Visit: Payer: Medicare Other | Admitting: Family

## 2022-07-07 MED ORDER — TRIAMTERENE-HCTZ 37.5-25 MG PO TABS: 1.0000 | ORAL_TABLET | Freq: Every day | ORAL | 1 refills | Status: DC

## 2022-07-07 NOTE — Telephone Encounter (Signed)
I have called the pt to confirm id she had picked up her medication. She stated that she is at lunch right now. She no longer needs the appt if the med has been called in.  I have canceled it for her and she will still let me know if she is unable to pick it up.   I have just called the pharmacy and they stated that they did see the rx and they are getting it ready for pt. I stated understanding.

## 2022-07-07 NOTE — Telephone Encounter (Signed)
Pt called stating that [facility] had not received the Rx we sent in on 10.27.23 for her BP meds. Pt was at the pharmacy at the time and verified this information with the rep. Advised a note would be sent back to resend it to the pharmacy. Pt acknowledged understanding.

## 2022-07-07 NOTE — Telephone Encounter (Signed)
I have resent the rx to the pharmacy. I have called the pt and informed her that it was resent and if they still dont have it then she can call us back to let us know.

## 2022-07-07 NOTE — Telephone Encounter (Signed)
I have called the pt to see how she is doing and she stated that she needed a refill of her BP medication. I have informed her that we have refilled it last week and she should contact the pharmacy to get the medication. I have confirmed the BP med and pharmacy.    She stated that she would like to have the ENT referral and I have given her the process of that.   She also stated that she has updated her insurance information.   She has also confirmed that she has updated her insurance information.   Please advise on the referral request for the pt.

## 2022-07-07 NOTE — Addendum Note (Signed)
Addended by: Kittie Plater, Mily Malecki HUA on: 07/07/2022 12:03 PM   Modules accepted: Orders

## 2022-07-12 ENCOUNTER — Other Ambulatory Visit: Payer: Self-pay | Admitting: Family

## 2022-07-12 ENCOUNTER — Other Ambulatory Visit: Payer: Medicare Other

## 2022-07-12 ENCOUNTER — Other Ambulatory Visit (INDEPENDENT_AMBULATORY_CARE_PROVIDER_SITE_OTHER): Payer: Medicare Other

## 2022-07-12 DIAGNOSIS — E559 Vitamin D deficiency, unspecified: Secondary | ICD-10-CM

## 2022-07-12 DIAGNOSIS — Z13220 Encounter for screening for lipoid disorders: Secondary | ICD-10-CM | POA: Diagnosis not present

## 2022-07-12 DIAGNOSIS — E039 Hypothyroidism, unspecified: Secondary | ICD-10-CM | POA: Diagnosis not present

## 2022-07-12 DIAGNOSIS — R0981 Nasal congestion: Secondary | ICD-10-CM

## 2022-07-12 DIAGNOSIS — I10 Essential (primary) hypertension: Secondary | ICD-10-CM

## 2022-07-12 LAB — LIPID PANEL
Cholesterol: 194 mg/dL (ref 0–200)
HDL: 51.6 mg/dL (ref >39.00)
LDL Cholesterol: 112 mg/dL — ABNORMAL HIGH (ref 0–99)
NonHDL: 142.09
Total CHOL/HDL Ratio: 4
Triglycerides: 150 mg/dL — ABNORMAL HIGH (ref 0.0–149.0)
VLDL: 30 mg/dL (ref 0.0–40.0)

## 2022-07-12 LAB — CBC WITH DIFFERENTIAL/PLATELET
Basophils Absolute: 0.1 10*3/uL (ref 0.0–0.1)
Basophils Relative: 1.3 % (ref 0.0–3.0)
Eosinophils Absolute: 0.1 10*3/uL (ref 0.0–0.7)
Eosinophils Relative: 1.7 % (ref 0.0–5.0)
HCT: 40.7 % (ref 36.0–46.0)
Hemoglobin: 13.7 g/dL (ref 12.0–15.0)
Lymphocytes Relative: 14.8 % (ref 12.0–46.0)
Lymphs Abs: 1 10*3/uL (ref 0.7–4.0)
MCHC: 33.7 g/dL (ref 30.0–36.0)
MCV: 94.2 fl (ref 78.0–100.0)
Monocytes Absolute: 0.4 10*3/uL (ref 0.1–1.0)
Monocytes Relative: 6.8 % (ref 3.0–12.0)
Neutro Abs: 5 10*3/uL (ref 1.4–7.7)
Neutrophils Relative %: 75.4 % (ref 43.0–77.0)
Platelets: 274 10*3/uL (ref 150.0–400.0)
RBC: 4.32 Mil/uL (ref 3.87–5.11)
RDW: 12.6 % (ref 11.5–15.5)
WBC: 6.6 10*3/uL (ref 4.0–10.5)

## 2022-07-12 LAB — COMPREHENSIVE METABOLIC PANEL
ALT: 17 U/L (ref 0–35)
AST: 16 U/L (ref 0–37)
Albumin: 4.2 g/dL (ref 3.5–5.2)
Alkaline Phosphatase: 68 U/L (ref 39–117)
BUN: 18 mg/dL (ref 6–23)
CO2: 32 mEq/L (ref 19–32)
Calcium: 9.4 mg/dL (ref 8.4–10.5)
Chloride: 103 mEq/L (ref 96–112)
Creatinine, Ser: 0.8 mg/dL (ref 0.40–1.20)
GFR: 70.02 mL/min (ref >60.00)
Glucose, Bld: 90 mg/dL (ref 70–99)
Potassium: 3.7 mEq/L (ref 3.5–5.1)
Sodium: 140 mEq/L (ref 135–145)
Total Bilirubin: 0.9 mg/dL (ref 0.2–1.2)
Total Protein: 7.2 g/dL (ref 6.0–8.3)

## 2022-07-12 LAB — TSH: TSH: 0.11 u[IU]/mL — ABNORMAL LOW (ref 0.35–5.50)

## 2022-07-12 LAB — VITAMIN D 25 HYDROXY (VIT D DEFICIENCY, FRACTURES): VITD: 37.53 ng/mL (ref 30.00–100.00)

## 2022-07-13 ENCOUNTER — Other Ambulatory Visit: Payer: Self-pay | Admitting: Family

## 2022-07-13 MED ORDER — LEVOTHYROXINE SODIUM 88 MCG PO TABS: 88.0000 ug | ORAL_TABLET | Freq: Every day | ORAL | 0 refills | Status: DC

## 2022-07-14 ENCOUNTER — Other Ambulatory Visit: Payer: Medicare Other

## 2022-07-15 DIAGNOSIS — U071 COVID-19: Secondary | ICD-10-CM | POA: Diagnosis not present

## 2022-08-15 DIAGNOSIS — R0981 Nasal congestion: Secondary | ICD-10-CM | POA: Diagnosis not present

## 2022-08-15 DIAGNOSIS — Z9109 Other allergy status, other than to drugs and biological substances: Secondary | ICD-10-CM | POA: Diagnosis not present

## 2022-09-20 ENCOUNTER — Ambulatory Visit (INDEPENDENT_AMBULATORY_CARE_PROVIDER_SITE_OTHER): Payer: Medicare Other | Admitting: Family

## 2022-09-20 ENCOUNTER — Encounter: Payer: Self-pay | Admitting: Family

## 2022-09-20 VITALS — BP 129/71 | HR 65 | Ht 62.0 in | Wt 138.6 lb

## 2022-09-20 DIAGNOSIS — R0789 Other chest pain: Secondary | ICD-10-CM

## 2022-09-20 DIAGNOSIS — Z8249 Family history of ischemic heart disease and other diseases of the circulatory system: Secondary | ICD-10-CM | POA: Diagnosis not present

## 2022-09-20 DIAGNOSIS — E039 Hypothyroidism, unspecified: Secondary | ICD-10-CM

## 2022-09-20 NOTE — Progress Notes (Signed)
Carol Lambert is a 82 y.o. female with the following history as recorded in EpicCare:  Patient Active Problem List   Diagnosis Date Noted   Endometrial cancer ([place]) 06/09/2017   Hearing loss 06/09/2017   Essential hypertension 04/21/2015    Current Outpatient Medications  Medication Sig Dispense Refill   Calcium Carb-Cholecalciferol (CALCIUM/VITAMIN D PO) Take 1 tablet daily by mouth.     ibuprofen (ADVIL,MOTRIN) 800 MG tablet Take 1 tablet (800 mg total) every 8 (eight) hours as needed by mouth (mild pain). 30 tablet 0   levothyroxine (SYNTHROID) 88 MCG tablet Take 1 tablet (88 mcg total) by mouth daily before breakfast. 90 tablet 0   triamterene-hydrochlorothiazide (MAXZIDE-25) 37.5-25 MG tablet Take 1 tablet by mouth daily. 90 tablet 1   No current facility-administered medications for this visit.    Allergies: Patient has no known allergies.  Past Medical History:  Diagnosis Date   Cancer [facility])    endometrial cancer    Complication of anesthesia    about 15 years ago , reports BP dropepd very low at that time    Hypothyroidism    Syncope    2 weeks ago ; was taking an antidepressant and wine together; did not get instructuons not to    Thyroid disease     Past Surgical History:  Procedure Laterality Date   BRACHIOPLASTY     bilateral  UE    CATARACT EXTRACTION     15 years ago    KNEE SURGERY Right    meniscus tear repair    LENS MATERIAL REMOVAL     ROBOTIC ASSISTED TOTAL HYSTERECTOMY WITH BILATERAL SALPINGO OOPHERECTOMY Bilateral 07/11/2017   Procedure: XI ROBOTIC ASSISTED TOTAL HYSTERECTOMY WITH BILATERAL SALPINGO OOPHORECTOMY WITH SENTINAL LYMPH NODES;  Surgeon: Everitt Amber, MD;  Location: [facility];  Service: Gynecology;  Laterality: Bilateral;    Family History  Problem Relation Age of Onset   Diabetes Mother    Heart failure Father    Breast cancer Maternal Aunt    Cancer Maternal Aunt     Social History   Tobacco Use   Smoking status: Never   Smokeless  tobacco: Never  Substance Use Topics   Alcohol use: Yes    Alcohol/week: 7.0 standard drinks of alcohol    Types: 7 Glasses of wine per week    Subjective:  Accompanied by her husband; notes she has been having some atypical chest pain x 2 months; requesting to see cardiology; no chest pain or shortness of breath on exertion; no relation to food; father had CAD and mother had history of heart failure; wants to get complete evaluation;   Also needs to get her thyroid level updated; notes has struggled to get her levels under control for year; her last provider had wanted her to try alternating between 88 and 100; would be open to meeting with endocrinologist;     Objective:  Vitals:   09/20/22 1340  BP: 129/71  Pulse: 65  SpO2: 99%  Weight: 138 lb 9.6 oz (62.9 kg)  Height: '5\' 2"'$  (1.575 m)    General: Well developed, well nourished, in no acute distress  Skin : Warm and dry.  Head: Normocephalic and atraumatic  Eyes: Sclera and conjunctiva clear; pupils round and reactive to light; extraocular movements intact  Ears: External normal; canals clear; tympanic membranes normal  Oropharynx: Pink, supple. No suspicious lesions  Neck: Supple without thyromegaly, adenopathy  Lungs: Respirations unlabored; clear to auscultation bilaterally without wheeze, rales, rhonchi  CVS  exam: normal rate and regular rhythm.  Neurologic: Alert and oriented; speech intact; face symmetrical; moves all extremities well; CNII-XII intact without focal deficit   Assessment:  1. Atypical chest pain   2. FH: CAD (coronary artery disease)   3. Hypothyroidism, unspecified type     Plan:  & 2. EKG shows sinus rhythm; no acute concerning changes notes that feel emergency room needed today; refer to cardiology; 3.   Update TSH today; to consider referral to endocrinology;   No follow-ups on file.  Orders Placed This Encounter  Procedures   CBC with Differential/Platelet   Comp Met (CMET)   TSH    Ambulatory referral to Cardiology    Referral Priority:   Routine    Referral Type:   Consultation    Referral Reason:   Specialty Services Required    Requested Specialty:   Cardiology    Number of Visits Requested:   1   EKG 12-Lead    Requested Prescriptions    No prescriptions requested or ordered in this encounter

## 2022-09-21 ENCOUNTER — Other Ambulatory Visit: Payer: Self-pay | Admitting: Family

## 2022-09-21 DIAGNOSIS — E039 Hypothyroidism, unspecified: Secondary | ICD-10-CM

## 2022-09-21 LAB — TSH: TSH: 0.97 u[IU]/mL (ref 0.35–5.50)

## 2022-09-21 LAB — CBC WITH DIFFERENTIAL/PLATELET
Basophils Absolute: 0.1 10*3/uL (ref 0.0–0.1)
Basophils Relative: 1.3 % (ref 0.0–3.0)
Eosinophils Absolute: 0.2 10*3/uL (ref 0.0–0.7)
Eosinophils Relative: 3.9 % (ref 0.0–5.0)
HCT: 39.2 % (ref 36.0–46.0)
Hemoglobin: 13.1 g/dL (ref 12.0–15.0)
Lymphocytes Relative: 33.2 % (ref 12.0–46.0)
Lymphs Abs: 1.9 10*3/uL (ref 0.7–4.0)
MCHC: 33.5 g/dL (ref 30.0–36.0)
MCV: 95.1 fl (ref 78.0–100.0)
Monocytes Absolute: 0.5 10*3/uL (ref 0.1–1.0)
Monocytes Relative: 8.7 % (ref 3.0–12.0)
Neutro Abs: 3.1 10*3/uL (ref 1.4–7.7)
Neutrophils Relative %: 52.9 % (ref 43.0–77.0)
Platelets: 379 10*3/uL (ref 150.0–400.0)
RBC: 4.12 Mil/uL (ref 3.87–5.11)
RDW: 13.4 % (ref 11.5–15.5)
WBC: 5.9 10*3/uL (ref 4.0–10.5)

## 2022-09-21 LAB — COMPREHENSIVE METABOLIC PANEL
ALT: 20 U/L (ref 0–35)
AST: 16 U/L (ref 0–37)
Albumin: 4.4 g/dL (ref 3.5–5.2)
Alkaline Phosphatase: 56 U/L (ref 39–117)
BUN: 21 mg/dL (ref 6–23)
CO2: 30 mEq/L (ref 19–32)
Calcium: 10 mg/dL (ref 8.4–10.5)
Chloride: 101 mEq/L (ref 96–112)
Creatinine, Ser: 0.83 mg/dL (ref 0.40–1.20)
GFR: 66.9 mL/min (ref >60.00)
Glucose, Bld: 92 mg/dL (ref 70–99)
Potassium: 3.8 mEq/L (ref 3.5–5.1)
Sodium: 141 mEq/L (ref 135–145)
Total Bilirubin: 0.4 mg/dL (ref 0.2–1.2)
Total Protein: 7.6 g/dL (ref 6.0–8.3)

## 2022-10-04 ENCOUNTER — Other Ambulatory Visit: Payer: Self-pay

## 2022-10-04 MED ORDER — LEVOTHYROXINE SODIUM 88 MCG PO TABS: 88.0000 ug | ORAL_TABLET | Freq: Every day | ORAL | 1 refills | Status: DC

## 2022-10-05 ENCOUNTER — Ambulatory Visit (INDEPENDENT_AMBULATORY_CARE_PROVIDER_SITE_OTHER): Payer: Medicare Other

## 2022-10-05 VITALS — Wt 138.0 lb

## 2022-10-05 DIAGNOSIS — Z0000 Encounter for general adult medical examination without abnormal findings: Secondary | ICD-10-CM

## 2022-10-05 NOTE — Patient Instructions (Signed)
Carol Lambert , Thank you for taking time to come for your Medicare Wellness Visit. I appreciate your ongoing commitment to your health goals. Please review the following plan we discussed and let me know if I can assist you in the future.   These are the goals we discussed:  Goals      Patient Stated     None at this time      Patient Stated     None at this time         This is a list of the screening recommended for you and due dates:  Health Maintenance  Topic Date Due   COVID-19 Vaccine (1) Never done   Hepatitis C Screening: USPSTF Recommendation to screen - Ages 57-79 yo.  Never done   DTaP/Tdap/Td vaccine (1 - Tdap) Never done   DEXA scan (bone density measurement)  Never done   Flu Shot  11/27/2022*   Medicare Annual Wellness Visit  10/06/2023   HPV Vaccine  Aged Out   Pneumonia Vaccine  Discontinued   Zoster (Shingles) Vaccine  Discontinued  *Topic was postponed. The date shown is not the original due date.    Advanced directives: Advance directive discussed with you today. Even though you declined this today please call our office should you change your mind and we can give you the proper paperwork for you to fill out.  Conditions/risks identified: none at this time   Next appointment: Follow up in one year for your annual wellness visit    Preventive Care 65 Years and Older, Female Preventive care refers to lifestyle choices and visits with your health care provider that can promote health and wellness. What does preventive care include? A yearly physical exam. This is also called an annual well check. Dental exams once or twice a year. Routine eye exams. Ask your health care provider how often you should have your eyes checked. Personal lifestyle choices, including: Daily care of your teeth and gums. Regular physical activity. Eating a healthy diet. Avoiding tobacco and drug use. Limiting alcohol use. Practicing safe sex. Taking low-dose aspirin every  day. Taking vitamin and mineral supplements as recommended by your health care provider. What happens during an annual well check? The services and screenings done by your health care provider during your annual well check will depend on your age, overall health, lifestyle risk factors, and family history of disease. Counseling  Your health care provider may ask you questions about your: Alcohol use. Tobacco use. Drug use. Emotional well-being. Home and relationship well-being. Sexual activity. Eating habits. History of falls. Memory and ability to understand (cognition). Work and work Statistician. Reproductive health. Screening  You may have the following tests or measurements: Height, weight, and BMI. Blood pressure. Lipid and cholesterol levels. These may be checked every 5 years, or more frequently if you are over 76 years old. Skin check. Lung cancer screening. You may have this screening every year starting at age 7 if you have a 30-pack-year history of smoking and currently smoke or have quit within the past 15 years. Fecal occult blood test (FOBT) of the stool. You may have this test every year starting at age 35. Flexible sigmoidoscopy or colonoscopy. You may have a sigmoidoscopy every 5 years or a colonoscopy every 10 years starting at age 49. Hepatitis C blood test. Hepatitis B blood test. Sexually transmitted disease (STD) testing. Diabetes screening. This is done by checking your blood sugar (glucose) after you have not eaten for a  while (fasting). You may have this done every 1-3 years. Bone density scan. This is done to screen for osteoporosis. You may have this done starting at age 53. Mammogram. This may be done every 1-2 years. Talk to your health care provider about how often you should have regular mammograms. Talk with your health care provider about your test results, treatment options, and if necessary, the need for more tests. Vaccines  Your health care  provider may recommend certain vaccines, such as: Influenza vaccine. This is recommended every year. Tetanus, diphtheria, and acellular pertussis (Tdap, Td) vaccine. You may need a Td booster every 10 years. Zoster vaccine. You may need this after age 38. Pneumococcal 13-valent conjugate (PCV13) vaccine. One dose is recommended after age 81. Pneumococcal polysaccharide (PPSV23) vaccine. One dose is recommended after age 2. Talk to your health care provider about which screenings and vaccines you need and how often you need them. This information is not intended to replace advice given to you by your health care provider. Make sure you discuss any questions you have with your health care provider. Document Released: 09/11/2015 Document Revised: 05/04/2016 Document Reviewed: 06/16/2015 Elsevier Interactive Patient Education  2017 [place] Prevention in the Home Falls can cause injuries. They can happen to people of all ages. There are many things you can do to make your home safe and to help prevent falls. What can I do on the outside of my home? Regularly fix the edges of walkways and driveways and fix any cracks. Remove anything that might make you trip as you walk through a door, such as a raised step or threshold. Trim any bushes or trees on the path to your home. Use bright outdoor lighting. Clear any walking paths of anything that might make someone trip, such as rocks or tools. Regularly check to see if handrails are loose or broken. Make sure that both sides of any steps have handrails. Any raised decks and porches should have guardrails on the edges. Have any leaves, snow, or ice cleared regularly. Use sand or salt on walking paths during winter. Clean up any spills in your garage right away. This includes oil or grease spills. What can I do in the bathroom? Use night lights. Install grab bars by the toilet and in the tub and shower. Do not use towel bars as grab  bars. Use non-skid mats or decals in the tub or shower. If you need to sit down in the shower, use a plastic, non-slip stool. Keep the floor dry. Clean up any water that spills on the floor as soon as it happens. Remove soap buildup in the tub or shower regularly. Attach bath mats securely with double-sided non-slip rug tape. Do not have throw rugs and other things on the floor that can make you trip. What can I do in the bedroom? Use night lights. Make sure that you have a light by your bed that is easy to reach. Do not use any sheets or blankets that are too big for your bed. They should not hang down onto the floor. Have a firm chair that has side arms. You can use this for support while you get dressed. Do not have throw rugs and other things on the floor that can make you trip. What can I do in the kitchen? Clean up any spills right away. Avoid walking on wet floors. Keep items that you use a lot in easy-to-reach places. If you need to reach something above you,  use a strong step stool that has a grab bar. Keep electrical cords out of the way. Do not use floor polish or wax that makes floors slippery. If you must use wax, use non-skid floor wax. Do not have throw rugs and other things on the floor that can make you trip. What can I do with my stairs? Do not leave any items on the stairs. Make sure that there are handrails on both sides of the stairs and use them. Fix handrails that are broken or loose. Make sure that handrails are as long as the stairways. Check any carpeting to make sure that it is firmly attached to the stairs. Fix any carpet that is loose or worn. Avoid having throw rugs at the top or bottom of the stairs. If you do have throw rugs, attach them to the floor with carpet tape. Make sure that you have a light switch at the top of the stairs and the bottom of the stairs. If you do not have them, ask someone to add them for you. What else can I do to help prevent  falls? Wear shoes that: Do not have high heels. Have rubber bottoms. Are comfortable and fit you well. Are closed at the toe. Do not wear sandals. If you use a stepladder: Make sure that it is fully opened. Do not climb a closed stepladder. Make sure that both sides of the stepladder are locked into place. Ask someone to hold it for you, if possible. Clearly mark and make sure that you can see: Any grab bars or handrails. First and last steps. Where the edge of each step is. Use tools that help you move around (mobility aids) if they are needed. These include: Canes. Walkers. Scooters. Crutches. Turn on the lights when you go into a dark area. Replace any light bulbs as soon as they burn out. Set up your furniture so you have a clear path. Avoid moving your furniture around. If any of your floors are uneven, fix them. If there are any pets around you, be aware of where they are. Review your medicines with your doctor. Some medicines can make you feel dizzy. This can increase your chance of falling. Ask your doctor what other things that you can do to help prevent falls. This information is not intended to replace advice given to you by your health care provider. Make sure you discuss any questions you have with your health care provider. Document Released: 06/11/2009 Document Revised: 01/21/2016 Document Reviewed: 09/19/2014 Elsevier Interactive Patient Education  2017 [employer].

## 2022-10-05 NOTE — Progress Notes (Addendum)
I connected with  Carol Lambert on 10/05/22 by a audio enabled telemedicine application and verified that I am speaking with the correct person using two identifiers.  Patient Location: Home  Provider Location: Home Office  I discussed the limitations of evaluation and management by telemedicine. The patient expressed understanding and agreed to proceed.   Subjective:   Carol Lambert is a 82 y.o. female who presents for an Initial Medicare Annual Wellness Visit.  Review of Systems     Cardiac Risk Factors include: advanced age (>50mn, >>53women);hypertension     Objective:    Today's Vitals   10/05/22 1257  Weight: 138 lb (62.6 kg)   Body mass index is 25.24 kg/m.     10/05/2022    1:02 PM 07/16/2018    1:32 PM 01/03/2018    1:23 PM 08/02/2017   11:57 AM 07/11/2017    7:00 PM 07/10/2017    2:27 PM 06/09/2017    9:14 AM  Advanced Directives  Does Patient Have a Medical Advance Directive? Yes Yes Yes Yes Yes Yes Yes  Type of [place]aramed[place]of [place] will Living will Healthcare Power of [place]umboldt River RanchLiving will Living will Living will [place]abLiving will  Does patient want to make changes to medical advance directive?  No - Patient declined   No - Patient declined No - Patient declined   Copy of [place]in Chart? No - copy requested  No - copy requested No - copy requested   No - copy requested    Current Medications (verified) Outpatient Encounter Medications as of 10/05/2022  Medication Sig   Calcium Carb-Cholecalciferol (CALCIUM/VITAMIN D PO) Take 1 tablet daily by mouth.   ibuprofen (ADVIL,MOTRIN) 800 MG tablet Take 1 tablet (800 mg total) every 8 (eight) hours as needed by mouth (mild pain).   levothyroxine (SYNTHROID) 88 MCG tablet Take 1 tablet (88 mcg total) by mouth daily before breakfast.   triamterene-hydrochlorothiazide (MAXZIDE-25) 37.5-25 MG tablet Take 1 tablet by  mouth daily.   No facility-administered encounter medications on file as of 10/05/2022.    Allergies (verified) Patient has no known allergies.   History: Past Medical History:  Diagnosis Date   [place]    endometrial cancer    Complication of anesthesia    about 15 years ago , reports BP dropepd very low at that time    Hypothyroidism    Syncope    2 weeks ago ; was taking an antidepressant and wine together; did not get instructuons not to    Thyroid disease    Past Surgical History:  Procedure Laterality Date   BRACHIOPLASTY     bilateral  UE    CATARACT EXTRACTION     15 years ago    KNEE SURGERY Right    meniscus tear repair    LENS MATERIAL REMOVAL     ROBOTIC ASSISTED TOTAL HYSTERECTOMY WITH BILATERAL SALPINGO OOPHERECTOMY Bilateral 07/11/2017   Procedure: XI ROBOTIC ASSISTED TOTAL HYSTERECTOMY WITH BILATERAL SALPINGO OOPHORECTOMY WITH SENTINAL LYMPH NODES;  Surgeon: REveritt Amber MD;  Location: [facility];  Service: Gynecology;  Laterality: Bilateral;   Family History  Problem Relation Age of Onset   Diabetes Mother    Heart failure Father    Breast cancer Maternal Aunt    Cancer Maternal Aunt    Social History   Socioeconomic History   Marital status: Married    Spouse name: Not on file   Number of  children: Not on file   Years of education: Not on file   Highest education level: Not on file  Occupational History   Not on file  Tobacco Use   Smoking status: Never   Smokeless tobacco: Never  Vaping Use   Vaping Use: Never used  Substance and Sexual Activity   Alcohol use: Yes    Alcohol/week: 7.0 standard drinks of alcohol    Types: 7 Glasses of wine per week   Drug use: No   Sexual activity: Not on file  Other Topics Concern   Not on file  Social History Narrative   Not on file   Social Determinants of Health   Financial Resource Strain: Low Risk  (10/04/2022)   Overall Financial Resource Strain (CARDIA)    Difficulty of Paying Living  Expenses: Not hard at all  Food Insecurity: No Food Insecurity (10/04/2022)   Hunger Vital Sign    Worried About Running Out of Food in the Last Year: Never true    Ran Out of Food in the Last Year: Never true  Transportation Needs: No Transportation Needs (10/04/2022)   PRAPARE - Hydrologist (Medical): No    Lack of Transportation (Non-Medical): No  Physical Activity: Insufficiently Active (10/04/2022)   Exercise Vital Sign    Days of Exercise per Week: 3 days    Minutes of Exercise per Session: 10 min  Stress: No Stress Concern Present (10/04/2022)   [place]    Feeling of Stress : Not at all  Social Connections: Unknown (10/04/2022)   Social Connection and Isolation Panel [NHANES]    Frequency of Communication with Friends and Family: Patient refused    Frequency of Social Gatherings with Friends and Family: Patient refused    Attends Religious Services: Not on Diplomatic Services operational officer of Clubs or Organizations: Yes    Attends Archivist Meetings: 1 to 4 times per year    Marital Status: Married    Tobacco Counseling Counseling given: Not Answered   Clinical Intake:  Pre-visit preparation completed: Yes  Pain : No/denies pain     BMI - recorded: 25.24 Nutritional Status: BMI 25 -29 Overweight Nutritional Risks: None Diabetes: No  How often do you need to have someone help you when you read instructions, pamphlets, or other written materials from your doctor or pharmacy?: 1 - Never  Diabetic?no  Interpreter Needed?: No  Information entered by :: Charlott Rakes, LPN   Activities of Daily Living    10/04/2022   12:03 PM  In your present state of health, do you have any difficulty performing the following activities:  Hearing? 0  Vision? 0  Difficulty concentrating or making decisions? 0  Walking or climbing stairs? 0  Dressing or bathing? 0  Doing errands,  shopping? 0  Preparing Food and eating ? N  Using the Toilet? N  In the past six months, have you accidently leaked urine? Y  Comment at time wears a pad  Do you have problems with loss of bowel control? N  Managing your Medications? N  Managing your Finances? N  Housekeeping or managing your Housekeeping? N    Patient Care Team: Marrian Salvage, FNP as PCP - General (Internal Medicine)  Indicate any recent Medical Services you may have received from other than [facility] providers in the past year (date may be approximate).     Assessment:   This is a  routine wellness examination for Dana.  Hearing/Vision screen Hearing Screening - Comments:: Pt wears hearing aids  Vision Screening - Comments:: Pt follows up with  new provider   Dietary issues and exercise activities discussed: Current Exercise Habits: Home exercise routine, Type of exercise: walking, Time (Minutes): 10, Frequency (Times/Week): 3, Weekly Exercise (Minutes/Week): 30   Goals Addressed             This Visit's Progress    Patient Stated       None at this time      Patient Stated       None at this time        Depression Screen    10/05/2022    1:01 PM  PHQ 2/9 Scores  PHQ - 2 Score 0    Fall Risk    10/04/2022   12:03 PM 06/24/2022   10:11 AM  [place] in the past year? 1 1  Number falls in past yr: 0 1  Injury with Fall? 0 1  Risk for fall due to : Impaired vision Impaired balance/gait;Other (Comment)  Risk for fall due to: Comment  caused by out reasons  Follow up Falls prevention discussed Falls evaluation completed;Falls prevention discussed    FALL RISK PREVENTION PERTAINING TO THE HOME:  Any stairs in or around the home? No  If so, are there any without handrails? No  Home free of loose throw rugs in walkways, pet beds, electrical cords, etc? Yes  Adequate lighting in your home to reduce risk of falls? Yes   ASSISTIVE DEVICES UTILIZED TO PREVENT FALLS:  Life  alert? No  Use of a cane, walker or w/c? No  Grab bars in the bathroom? Yes  Shower chair or bench in shower? No  Elevated toilet seat or a handicapped toilet? No   TIMED UP AND GO:  Was the test performed? No .    Cognitive Function:        10/05/2022    1:04 PM  6CIT Screen  What Year? 0 points  What month? 0 points  What time? 0 points  Count back from 20 0 points  Months in reverse 4 points  Repeat phrase 0 points  Total Score 4 points    Immunizations  There is no immunization history on file for this patient.  TDAP status: Due, Education has been provided regarding the importance of this vaccine. Advised may receive this vaccine at local pharmacy or Health Dept. Aware to provide a copy of the vaccination record if obtained from local pharmacy or Health Dept. Verbalized acceptance and understanding.  Flu Vaccine status: Declined, Education has been provided regarding the importance of this vaccine but patient still declined. Advised may receive this vaccine at local pharmacy or Health Dept. Aware to provide a copy of the vaccination record if obtained from local pharmacy or Health Dept. Verbalized acceptance and understanding.  Pneumococcal vaccine status: Due, Education has been provided regarding the importance of this vaccine. Advised may receive this vaccine at local pharmacy or Health Dept. Aware to provide a copy of the vaccination record if obtained from local pharmacy or Health Dept. Verbalized acceptance and understanding.  Covid-19 vaccine status: Declined, Education has been provided regarding the importance of this vaccine but patient still declined. Advised may receive this vaccine at local pharmacy or Health Dept.or vaccine clinic. Aware to provide a copy of the vaccination record if obtained from local pharmacy or Health Dept. Verbalized acceptance and understanding.  Qualifies for Shingles Vaccine? Yes   Zostavax completed No   Shingrix Completed?: No.     Education has been provided regarding the importance of this vaccine. Patient has been advised to call insurance company to determine out of pocket expense if they have not yet received this vaccine. Advised may also receive vaccine at local pharmacy or Health Dept. Verbalized acceptance and understanding.  Screening Tests Health Maintenance  Topic Date Due   COVID-19 Vaccine (1) Never done   Hepatitis C Screening  Never done   DTaP/Tdap/Td (1 - Tdap) Never done   DEXA SCAN  Never done   INFLUENZA VACCINE  11/27/2022 (Originally 03/29/2022)   Medicare Annual Wellness (AWV)  10/06/2023   HPV VACCINES  Aged Out   Pneumonia Vaccine 110+ Years old  Discontinued   Zoster Vaccines- Shingrix  Discontinued    Health Maintenance  Health Maintenance Due  Topic Date Due   COVID-19 Vaccine (1) Never done   Hepatitis C Screening  Never done   DTaP/Tdap/Td (1 - Tdap) Never done   DEXA SCAN  Never done        Pt stated completed colonoscopy and mammograms    Additional Screening:  Hepatitis C Screening: does qualify;   Vision Screening: Recommended annual ophthalmology exams for early detection of glaucoma and other disorders of the eye. Is the patient up to date with their annual eye exam?  Yes  Who is the provider or what is the name of the office in which the patient attends annual eye exams? Unsure of poviders name  If pt is not established with a provider, would they like to be referred to a provider to establish care? No .   Dental Screening: Recommended annual dental exams for proper oral hygiene  Community Resource Referral / Chronic Care Management: CRR required this visit?  No   CCM required this visit?  No      Plan:     I have personally reviewed and noted the following in the patient's chart:   Medical and social history Use of alcohol, tobacco or illicit drugs  Current medications and supplements including opioid prescriptions. Patient is not currently taking  opioid prescriptions. Functional ability and status Nutritional status Physical activity Advanced directives List of other physicians Hospitalizations, surgeries, and ER visits in previous 12 months Vitals Screenings to include cognitive, depression, and falls Referrals and appointments  In addition, I have reviewed and discussed with patient certain preventive protocols, quality metrics, and best practice recommendations. A written personalized care plan for preventive services as well as general preventive health recommendations were provided to patient.     Willette Brace, LPN   [id number]   Nurse Notes: none  Patient Medicare AWV questionnaire was completed by the patient on 10/04/22; I have confirmed that all information answered by patient is correct and no changes since this date.

## 2022-10-07 NOTE — Progress Notes (Signed)
I have reviewed and agree with [facility] documentation.  Kathlene November, MD

## 2022-10-10 ENCOUNTER — Ambulatory Visit: Payer: Medicare Other | Attending: Cardiology | Admitting: Cardiology

## 2022-10-10 ENCOUNTER — Encounter: Payer: Self-pay | Admitting: Cardiology

## 2022-10-10 VITALS — BP 120/50 | HR 53 | Ht 62.0 in

## 2022-10-10 DIAGNOSIS — R079 Chest pain, unspecified: Secondary | ICD-10-CM

## 2022-10-10 DIAGNOSIS — R072 Precordial pain: Secondary | ICD-10-CM | POA: Diagnosis not present

## 2022-10-10 DIAGNOSIS — I10 Essential (primary) hypertension: Secondary | ICD-10-CM | POA: Diagnosis not present

## 2022-10-10 MED ORDER — METOPROLOL TARTRATE 25 MG PO TABS: 25.0000 mg | ORAL_TABLET | ORAL | 0 refills | Status: DC

## 2022-10-10 NOTE — Patient Instructions (Addendum)
Medication Instructions:  The current medical regimen is effective;  continue present plan and medications.  *If you need a refill on your cardiac medications before your next appointment, please call your pharmacy*   Lab Work: None today If you have labs (blood work) drawn today and your tests are completely normal, you will receive your results only by: [place] (if you have MyChart) OR A paper copy in the mail If you have any lab test that is abnormal or we need to change your treatment, we will call you to review the results.   Testing/Procedures:   Your cardiac CT will be scheduled at:   [facility] [address] [place], [place] [address] [phone]  Please arrive at the [facility] and Children's Entrance (Entrance C2) of [facility] 30 minutes prior to test start time. You can use the FREE valet parking offered at entrance C (encouraged to control the heart rate for the test)  Proceed to the [facility] Radiology Department (first floor) to check-in and test prep.  All radiology patients and guests should use entrance C2 at [facility], accessed from [facility], even though the hospital's physical address listed is [address]    Please follow these instructions carefully (unless otherwise directed):  On the Night Before the Test: Be sure to Drink plenty of water. Do not consume any caffeinated/decaffeinated beverages or chocolate 12 hours prior to your test. Do not take any antihistamines 12 hours prior to your test.  On the Day of the Test: Drink plenty of water until 1 hour prior to the test. Do not eat any food 1 hour prior to test. You may take your regular medications prior to the test.  Take metoprolol (Lopressor) two hours prior to test. If you take Furosemide/Hydrochlorothiazide/Spironolactone, please HOLD on the morning of the test. FEMALES- please wear underwire-free bra if available, avoid  dresses & tight clothing      After the Test: Drink plenty of water. After receiving IV contrast, you may experience a mild flushed feeling. This is normal. On occasion, you may experience a mild rash up to 24 hours after the test. This is not dangerous. If this occurs, you can take Benadryl 25 mg and increase your fluid intake. If you experience trouble breathing, this can be serious. If it is severe call 911 IMMEDIATELY. If it is mild, please call our office.  We will call to schedule your test 2-4 weeks out understanding that some insurance companies will need an authorization prior to the service being performed.   For non-scheduling related questions, please contact the cardiac imaging nurse navigator should you have any questions/concerns: Marchia Bond, Cardiac Imaging Nurse Navigator Gordy Clement, Cardiac Imaging Nurse Navigator [facility] Direct Office Dial: [phone]   For scheduling needs, including cancellations and rescheduling, please call [place], [phone].   Follow-Up: At [facility], you and your health needs are our priority.  As part of our continuing mission to provide you with exceptional heart care, we have created designated Provider Care Teams.  These Care Teams include your primary Cardiologist (physician) and Advanced Practice Providers (APPs -  Physician Assistants and Nurse Practitioners) who all work together to provide you with the care you need, when you need it.  We recommend signing up for the patient portal called "MyChart".  Sign up information is provided on this After Visit Summary.  MyChart is used to connect with patients for Virtual  Visits (Telemedicine).  Patients are able to view lab/test results, encounter notes, upcoming appointments, etc.  Non-urgent messages can be sent to your provider as well.   To learn more about what you can do with MyChart, go to [email or web address].    Your next  appointment:   Follow up will be based on the results of the above testing.

## 2022-10-10 NOTE — Progress Notes (Signed)
Cardiology Office Note:    Date:  10/10/2022   ID:  Carol Lambert, DOB June 11, 1941, MRN [id number]  PCP:  Marrian Salvage, [place] Providers Cardiologist:  None     Referring MD: Marrian Salvage,*    History of Present Illness:    Carol Lambert is a 82 y.o. female here for the evaluation of chest pain at the request of Dr. Valere Dross.  In review from her office note, she had been having some chest discomfort over the past 2 months, under immense stress. Felt severe tightness.  She was living in [place] with her husband Clair Gulling.  They needed to leave.  They sold their house.  They were hoping to go straight to [facility] however they needed to buy a house here first.  Religious.  Had requested to come to cardiology for evaluation.  Her father had coronary artery disease prior surgery. mother had a history of heart failure.  She has been struggling to get her thyroid levels under good control.  Feels better now.  Denies any fevers chills nausea vomiting syncope bleeding.  Past Medical History:  Diagnosis Date   Cancer [facility])    endometrial cancer    Complication of anesthesia    about 15 years ago , reports BP dropepd very low at that time    Hypothyroidism    Syncope    2 weeks ago ; was taking an antidepressant and wine together; did not get instructuons not to    Thyroid disease     Past Surgical History:  Procedure Laterality Date   BRACHIOPLASTY     bilateral  UE    CATARACT EXTRACTION     15 years ago    KNEE SURGERY Right    meniscus tear repair    LENS MATERIAL REMOVAL     ROBOTIC ASSISTED TOTAL HYSTERECTOMY WITH BILATERAL SALPINGO OOPHERECTOMY Bilateral 07/11/2017   Procedure: XI ROBOTIC ASSISTED TOTAL HYSTERECTOMY WITH BILATERAL SALPINGO OOPHORECTOMY WITH SENTINAL LYMPH NODES;  Surgeon: Everitt Amber, MD;  Location: [facility];  Service: Gynecology;  Laterality: Bilateral;    Current Medications: Current Meds  Medication Sig   Calcium  Carb-Cholecalciferol (CALCIUM/VITAMIN D PO) Take 1 tablet daily by mouth.   ibuprofen (ADVIL,MOTRIN) 800 MG tablet Take 1 tablet (800 mg total) every 8 (eight) hours as needed by mouth (mild pain).   levothyroxine (SYNTHROID) 88 MCG tablet Take 1 tablet (88 mcg total) by mouth daily before breakfast.   metoprolol tartrate (LOPRESSOR) 25 MG tablet Take 1 tablet (25 mg total) by mouth as directed. Take 1 tablet two hours before your CT scan   triamterene-hydrochlorothiazide (MAXZIDE-25) 37.5-25 MG tablet Take 1 tablet by mouth daily.     Allergies:   Patient has no known allergies.   Social History   Socioeconomic History   Marital status: Married    Spouse name: Not on file   Number of children: Not on file   Years of education: Not on file   Highest education level: Not on file  Occupational History   Not on file  Tobacco Use   Smoking status: Never   Smokeless tobacco: Never  Vaping Use   Vaping Use: Never used  Substance and Sexual Activity   Alcohol use: Yes    Alcohol/week: 7.0 standard drinks of alcohol    Types: 7 Glasses of wine per week   Drug use: No   Sexual activity: Not on file  Other Topics Concern   Not on file  Social History Narrative   Not on file   Social Determinants of Health   Financial Resource Strain: Low Risk  (10/04/2022)   Overall Financial Resource Strain (CARDIA)    Difficulty of Paying Living Expenses: Not hard at all  Food Insecurity: No Food Insecurity (10/04/2022)   Hunger Vital Sign    Worried About Running Out of Food in the Last Year: Never true    Ran Out of Food in the Last Year: Never true  Transportation Needs: No Transportation Needs (10/04/2022)   PRAPARE - Hydrologist (Medical): No    Lack of Transportation (Non-Medical): No  Physical Activity: Insufficiently Active (10/04/2022)   Exercise Vital Sign    Days of Exercise per Week: 3 days    Minutes of Exercise per Session: 10 min  Stress: No Stress  Concern Present (10/04/2022)   [place]    Feeling of Stress : Not at all  Social Connections: Unknown (10/04/2022)   Social Connection and Isolation Panel [NHANES]    Frequency of Communication with Friends and Family: Patient refused    Frequency of Social Gatherings with Friends and Family: Patient refused    Attends Religious Services: Not on Diplomatic Services operational officer of Clubs or Organizations: Yes    Attends Archivist Meetings: 1 to 4 times per year    Marital Status: Married     Family History: The patient's family history includes Breast cancer in her maternal aunt; Cancer in her maternal aunt; Diabetes in her mother; Heart failure in her father.  ROS:   Please see the history of present illness.     All other systems reviewed and are negative.  EKGs/Labs/Other Studies Reviewed:    The following studies were reviewed today: Prior office notes reviewed  EKG: 09/20/2022: Sinus rhythm 65 no other specific abnormalities.  Recent Labs: 09/20/2022: ALT 20; BUN 21; Creatinine, Ser 0.83; Hemoglobin 13.1; Platelets 379.0; Potassium 3.8; Sodium 141; TSH 0.97  Recent Lipid Panel    Component Value Date/Time   CHOL 194 07/12/2022 0813   TRIG 150.0 (H) 07/12/2022 0813   HDL 51.60 07/12/2022 0813   CHOLHDL 4 07/12/2022 0813   VLDL 30.0 07/12/2022 0813   LDLCALC 112 (H) 07/12/2022 0813     Risk Assessment/Calculations:               Physical Exam:    VS:  BP (!) 120/50   Pulse (!) 53   Ht 5' 2"$  (1.575 m)   SpO2 96%   BMI 25.24 kg/m     Wt Readings from Last 3 Encounters:  10/05/22 138 lb (62.6 kg)  09/20/22 138 lb 9.6 oz (62.9 kg)  06/24/22 137 lb (62.1 kg)     GEN:  Well nourished, well developed in no acute distress HEENT: Normal NECK: No JVD; No carotid bruits LYMPHATICS: No lymphadenopathy CARDIAC: RRR, no murmurs, rubs, gallops RESPIRATORY:  Clear to auscultation without rales,  wheezing or rhonchi  ABDOMEN: Soft, non-tender, non-distended MUSCULOSKELETAL:  No edema; No deformity  SKIN: Warm and dry NEUROLOGIC:  Alert and oriented x 3 PSYCHIATRIC:  Normal affect   ASSESSMENT:    1. Chest pain in adult   2. Precordial pain   3. Essential hypertension    PLAN:    In order of problems listed above:  Precordial chest pain - Recently under periods of severe stress she ended up having severe chest tightness.  Thankfully,  this has improved.  Based upon her family history with her father having heart surgery and mother also having heart disease, we will go ahead and proceed with coronary CT scan for further analysis.  Plan will be based upon CT results. -Continue with risk factor prevention, diet, exercise.  Hypertension - Currently on triamterene hydrochlorothiazide.  Stable.  Well-controlled.  Prior creatinine 0.83 LDL 112 TSH 0.9  Hearing loss - She had a prior ear surgery.  She states that she cannot have an MRI.          Medication Adjustments/Labs and Tests Ordered: Current medicines are reviewed at length with the patient today.  Concerns regarding medicines are outlined above.  Orders Placed This Encounter  Procedures   CT CORONARY MORPH W/CTA COR W/SCORE W/CA W/CM &/OR WO/CM   Meds ordered this encounter  Medications   metoprolol tartrate (LOPRESSOR) 25 MG tablet    Sig: Take 1 tablet (25 mg total) by mouth as directed. Take 1 tablet two hours before your CT scan    Dispense:  1 tablet    Refill:  0    Patient Instructions  Medication Instructions:  The current medical regimen is effective;  continue present plan and medications.  *If you need a refill on your cardiac medications before your next appointment, please call your pharmacy*   Lab Work: None today If you have labs (blood work) drawn today and your tests are completely normal, you will receive your results only by: [place] (if you have MyChart) OR A paper copy in the  mail If you have any lab test that is abnormal or we need to change your treatment, we will call you to review the results.   Testing/Procedures:   Your cardiac CT will be scheduled at:   [facility] [address] [place], [place] [address] [phone]  Please arrive at the [facility] and Children's Entrance (Entrance C2) of [facility] 30 minutes prior to test start time. You can use the FREE valet parking offered at entrance C (encouraged to control the heart rate for the test)  Proceed to the [facility] Radiology Department (first floor) to check-in and test prep.  All radiology patients and guests should use entrance C2 at [facility], accessed from [facility], even though the hospital's physical address listed is [address]    Please follow these instructions carefully (unless otherwise directed):  On the Night Before the Test: Be sure to Drink plenty of water. Do not consume any caffeinated/decaffeinated beverages or chocolate 12 hours prior to your test. Do not take any antihistamines 12 hours prior to your test.  On the Day of the Test: Drink plenty of water until 1 hour prior to the test. Do not eat any food 1 hour prior to test. You may take your regular medications prior to the test.  Take metoprolol (Lopressor) two hours prior to test. If you take Furosemide/Hydrochlorothiazide/Spironolactone, please HOLD on the morning of the test. FEMALES- please wear underwire-free bra if available, avoid dresses & tight clothing      After the Test: Drink plenty of water. After receiving IV contrast, you may experience a mild flushed feeling. This is normal. On occasion, you may experience a mild rash up to 24 hours after the test. This is not dangerous. If this occurs, you can take Benadryl 25 mg and increase your fluid intake. If you experience trouble breathing, this can be serious. If it is severe  call 911 IMMEDIATELY.  If it is mild, please call our office.  We will call to schedule your test 2-4 weeks out understanding that some insurance companies will need an authorization prior to the service being performed.   For non-scheduling related questions, please contact the cardiac imaging nurse navigator should you have any questions/concerns: Marchia Bond, Cardiac Imaging Nurse Navigator Gordy Clement, Cardiac Imaging Nurse Navigator [facility] Direct Office Dial: [phone]   For scheduling needs, including cancellations and rescheduling, please call [place], [phone].   Follow-Up: At [facility], you and your health needs are our priority.  As part of our continuing mission to provide you with exceptional heart care, we have created designated Provider Care Teams.  These Care Teams include your primary Cardiologist (physician) and Advanced Practice Providers (APPs -  Physician Assistants and Nurse Practitioners) who all work together to provide you with the care you need, when you need it.  We recommend signing up for the patient portal called "MyChart".  Sign up information is provided on this After Visit Summary.  MyChart is used to connect with patients for Virtual Visits (Telemedicine).  Patients are able to view lab/test results, encounter notes, upcoming appointments, etc.  Non-urgent messages can be sent to your provider as well.   To learn more about what you can do with MyChart, go to [email or web address].    Your next appointment:   Follow up will be based on the results of the above testing.   Signed, Candee Furbish, MD  10/10/2022 12:23 PM    [place]

## 2022-10-13 ENCOUNTER — Telehealth: Payer: Self-pay | Admitting: *Deleted

## 2022-10-13 DIAGNOSIS — Z79899 Other long term (current) drug therapy: Secondary | ICD-10-CM

## 2022-10-13 DIAGNOSIS — Z01812 Encounter for preprocedural laboratory examination: Secondary | ICD-10-CM

## 2022-10-13 DIAGNOSIS — R072 Precordial pain: Secondary | ICD-10-CM

## 2022-10-13 NOTE — Telephone Encounter (Signed)
Pt is aware lab work is needed prior to Coronary CT.  Orders placed and released for [facility].  She will have it drawn at their [facility] location

## 2022-10-17 ENCOUNTER — Other Ambulatory Visit: Payer: Self-pay | Admitting: *Deleted

## 2022-10-17 DIAGNOSIS — Z01812 Encounter for preprocedural laboratory examination: Secondary | ICD-10-CM

## 2022-10-17 DIAGNOSIS — R072 Precordial pain: Secondary | ICD-10-CM

## 2022-10-18 ENCOUNTER — Other Ambulatory Visit (HOSPITAL_COMMUNITY): Payer: Medicare Other

## 2022-10-18 DIAGNOSIS — Z01812 Encounter for preprocedural laboratory examination: Secondary | ICD-10-CM | POA: Diagnosis not present

## 2022-10-18 DIAGNOSIS — R072 Precordial pain: Secondary | ICD-10-CM | POA: Diagnosis not present

## 2022-10-18 LAB — BASIC METABOLIC PANEL
BUN/Creatinine Ratio: 20 (ref 12–28)
BUN: 16 mg/dL (ref 8–27)
CO2: 26 mmol/L (ref 20–29)
Calcium: 9.6 mg/dL (ref 8.7–10.3)
Chloride: 100 mmol/L (ref 96–106)
Creatinine, Ser: 0.82 mg/dL (ref 0.57–1.00)
Glucose: 90 mg/dL (ref 70–99)
Potassium: 3.5 mmol/L (ref 3.5–5.2)
Sodium: 141 mmol/L (ref 134–144)
eGFR: 73 mL/min/{1.73_m2} (ref >59)

## 2022-10-27 ENCOUNTER — Telehealth (HOSPITAL_COMMUNITY): Payer: Self-pay | Admitting: Emergency Medicine

## 2022-10-27 NOTE — Telephone Encounter (Signed)
Reaching out to patient to offer assistance regarding upcoming cardiac imaging study; pt verbalizes understanding of appt date/time, parking situation and where to check in, pre-test NPO status and medications ordered, and verified current allergies; name and call back number provided for further questions should they arise Marchia Bond RN Navigator Cardiac Imaging [facility] [phone] office [phone] cell  Arrival [address] entrance Denies iv issues '25mg'$  metoprolol tartrate Aware contrast/nitro

## 2022-10-28 ENCOUNTER — Ambulatory Visit (HOSPITAL_COMMUNITY)
Admission: RE | Admit: 2022-10-28 | Discharge: 2022-10-28 | Disposition: A | Discharge status: Home / Self Care | Payer: Medicare Other | Source: Ambulatory Visit | Attending: Cardiology | Admitting: Cardiology

## 2022-10-28 DIAGNOSIS — R079 Chest pain, unspecified: Secondary | ICD-10-CM | POA: Insufficient documentation

## 2022-10-28 DIAGNOSIS — R072 Precordial pain: Secondary | ICD-10-CM | POA: Diagnosis not present

## 2022-10-28 MED ORDER — IOHEXOL 350 MG/ML SOLN
100.0000 mL | Freq: Once | INTRAVENOUS | Status: AC | PRN
  Administered 2022-10-28: 100 mL via INTRAVENOUS

## 2022-10-28 MED ORDER — NITROGLYCERIN 0.4 MG SL SUBL
0.8000 mg | SUBLINGUAL_TABLET | SUBLINGUAL | Status: DC | PRN
  Administered 2022-10-28: 0.8 mg via SUBLINGUAL

## 2022-10-28 MED ORDER — NITROGLYCERIN 0.4 MG SL SUBL
SUBLINGUAL_TABLET | SUBLINGUAL | Status: AC
  Filled 2022-10-28: qty 2

## 2022-11-08 DIAGNOSIS — H524 Presbyopia: Secondary | ICD-10-CM | POA: Diagnosis not present

## 2023-01-02 DIAGNOSIS — K080 Exfoliation of teeth due to systemic causes: Secondary | ICD-10-CM | POA: Diagnosis not present

## 2023-01-03 ENCOUNTER — Other Ambulatory Visit: Payer: Self-pay | Admitting: Family

## 2023-01-04 DIAGNOSIS — K080 Exfoliation of teeth due to systemic causes: Secondary | ICD-10-CM | POA: Diagnosis not present

## 2023-02-09 DIAGNOSIS — Z961 Presence of intraocular lens: Secondary | ICD-10-CM | POA: Diagnosis not present

## 2023-02-09 DIAGNOSIS — H40013 Open angle with borderline findings, low risk, bilateral: Secondary | ICD-10-CM | POA: Diagnosis not present

## 2023-03-03 ENCOUNTER — Ambulatory Visit (INDEPENDENT_AMBULATORY_CARE_PROVIDER_SITE_OTHER): Payer: Medicare Other | Admitting: Family

## 2023-03-03 VITALS — BP 112/62 | HR 63 | Temp 97.6°F | Resp 18 | Ht 62.0 in | Wt 138.0 lb

## 2023-03-03 DIAGNOSIS — R490 Dysphonia: Secondary | ICD-10-CM | POA: Diagnosis not present

## 2023-03-03 NOTE — Progress Notes (Signed)
Carol Lambert is a 82 y.o. female with the following history as recorded in EpicCare:  Patient Active Problem List   Diagnosis Date Noted   Endometrial cancer (HCC) 06/09/2017   Hearing loss 06/09/2017   Essential hypertension 04/21/2015    Current Outpatient Medications  Medication Sig Dispense Refill   ibuprofen (ADVIL,MOTRIN) 800 MG tablet Take 1 tablet (800 mg total) every 8 (eight) hours as needed by mouth (mild pain). 30 tablet 0   levothyroxine (SYNTHROID) 88 MCG tablet Take 1 tablet (88 mcg total) by mouth daily before breakfast. 90 tablet 1   triamterene-hydrochlorothiazide (MAXZIDE-25) 37.5-25 MG tablet TAKE ONE (1) TABLET BY MOUTH EACH DAY 90 tablet 1   No current facility-administered medications for this visit.    Allergies: Patient has no known allergies.  Past Medical History:  Diagnosis Date   Cancer [facility])    endometrial cancer    Complication of anesthesia    about 15 years ago , reports BP dropepd very low at that time    Hypothyroidism    Syncope    2 weeks ago ; was taking an antidepressant and wine together; did not get instructuons not to    Thyroid disease     Past Surgical History:  Procedure Laterality Date   BRACHIOPLASTY     bilateral  UE    CATARACT EXTRACTION     15 years ago    KNEE SURGERY Right    meniscus tear repair    LENS MATERIAL REMOVAL     ROBOTIC ASSISTED TOTAL HYSTERECTOMY WITH BILATERAL SALPINGO OOPHERECTOMY Bilateral 07/11/2017   Procedure: XI ROBOTIC ASSISTED TOTAL HYSTERECTOMY WITH BILATERAL SALPINGO OOPHORECTOMY WITH SENTINAL LYMPH NODES;  Surgeon: Adolphus Birchwood, MD;  Location: [facility];  Service: Gynecology;  Laterality: Bilateral;    Family History  Problem Relation Age of Onset   Diabetes Mother    Heart failure Father    Breast cancer Maternal Aunt    Cancer Maternal Aunt     Social History   Tobacco Use   Smoking status: Never   Smokeless tobacco: Never  Substance Use Topics   Alcohol use: Yes    Alcohol/week: 7.0  standard drinks of alcohol    Types: 7 Glasses of wine per week    Subjective:   Concerned for hoarseness/ scratchy throat x 8-10 months; she is requesting to see specialists to have her throat checked out; admits that has numerous friends with esophageal cancer and very concerned about this disease possibility; denies any reflux symptoms or difficulty swallowing/ sensation of food getting stuck; does feel that symptoms are intermittent and may have been worse during spring allergy season;   Objective:  Vitals:   03/03/23 1024  BP: 112/62  Pulse: 63  Resp: 18  Temp: 97.6 F (36.4 C)  TempSrc: Temporal  SpO2: 97%  Weight: 138 lb (62.6 kg)  Height: 5\' 2"  (1.575 m)    General: Well developed, well nourished, in no acute distress  Skin : Warm and dry.  Head: Normocephalic and atraumatic  Eyes: Sclera and conjunctiva clear; pupils round and reactive to light; extraocular movements intact  Ears: External normal; canals clear; tympanic membranes normal  Oropharynx: Pink, supple. No suspicious lesions  Neck: Supple without thyromegaly, adenopathy  Lungs: Respirations unlabored;  Neurologic: Alert and oriented; speech intact; face symmetrical; moves all extremities well; CNII-XII intact without focal deficit   Assessment:  1. Hoarseness     Plan:  Suspect allergy component but appreciate patient's concerns; will update referral to  ENT and GI to discuss further; she defers medication at this time.   No follow-ups on file.  Orders Placed This Encounter  Procedures   Ambulatory referral to ENT    Referral Priority:   Routine    Referral Type:   Consultation    Referral Reason:   Specialty Services Required    Requested Specialty:   Otolaryngology    Number of Visits Requested:   1   Ambulatory referral to Gastroenterology    Referral Priority:   Routine    Referral Type:   Consultation    Referral Reason:   Specialty Services Required    Number of Visits Requested:   1     Requested Prescriptions    No prescriptions requested or ordered in this encounter

## 2023-03-15 ENCOUNTER — Encounter: Payer: Self-pay | Admitting: Family

## 2023-04-05 DIAGNOSIS — K219 Gastro-esophageal reflux disease without esophagitis: Secondary | ICD-10-CM | POA: Diagnosis not present

## 2023-04-05 DIAGNOSIS — H6122 Impacted cerumen, left ear: Secondary | ICD-10-CM | POA: Diagnosis not present

## 2023-04-18 DIAGNOSIS — H40013 Open angle with borderline findings, low risk, bilateral: Secondary | ICD-10-CM | POA: Diagnosis not present

## 2023-04-18 DIAGNOSIS — H4301 Vitreous prolapse, right eye: Secondary | ICD-10-CM | POA: Diagnosis not present

## 2023-04-18 DIAGNOSIS — Z961 Presence of intraocular lens: Secondary | ICD-10-CM | POA: Diagnosis not present

## 2023-04-24 ENCOUNTER — Other Ambulatory Visit: Payer: Self-pay | Admitting: Family

## 2023-06-06 DIAGNOSIS — L814 Other melanin hyperpigmentation: Secondary | ICD-10-CM | POA: Diagnosis not present

## 2023-06-06 DIAGNOSIS — L738 Other specified follicular disorders: Secondary | ICD-10-CM | POA: Diagnosis not present

## 2023-06-06 DIAGNOSIS — L820 Inflamed seborrheic keratosis: Secondary | ICD-10-CM | POA: Diagnosis not present

## 2023-06-12 DIAGNOSIS — K080 Exfoliation of teeth due to systemic causes: Secondary | ICD-10-CM | POA: Diagnosis not present

## 2023-07-17 ENCOUNTER — Other Ambulatory Visit: Payer: Self-pay | Admitting: Family

## 2023-07-18 ENCOUNTER — Other Ambulatory Visit: Payer: Self-pay | Admitting: Family

## 2023-07-18 ENCOUNTER — Ambulatory Visit: Payer: Medicare Other | Admitting: Family

## 2023-07-18 ENCOUNTER — Telehealth: Payer: Self-pay | Admitting: Family

## 2023-07-18 DIAGNOSIS — Z13220 Encounter for screening for lipoid disorders: Secondary | ICD-10-CM

## 2023-07-18 DIAGNOSIS — E559 Vitamin D deficiency, unspecified: Secondary | ICD-10-CM

## 2023-07-18 DIAGNOSIS — E039 Hypothyroidism, unspecified: Secondary | ICD-10-CM

## 2023-07-18 DIAGNOSIS — Z0000 Encounter for general adult medical examination without abnormal findings: Secondary | ICD-10-CM

## 2023-07-18 NOTE — Telephone Encounter (Signed)
Pt has a lab appointment scheduled for 07/26/2023.

## 2023-07-18 NOTE — Telephone Encounter (Signed)
Called pt and left a VM asking pt to call the office back to schedule a lab appointment.

## 2023-07-18 NOTE — Telephone Encounter (Signed)
Patient wanted labs drawn a week prior to cpe appt on 12/5.

## 2023-07-26 ENCOUNTER — Other Ambulatory Visit (INDEPENDENT_AMBULATORY_CARE_PROVIDER_SITE_OTHER): Payer: Medicare Other

## 2023-07-26 DIAGNOSIS — E559 Vitamin D deficiency, unspecified: Secondary | ICD-10-CM

## 2023-07-26 DIAGNOSIS — Z13220 Encounter for screening for lipoid disorders: Secondary | ICD-10-CM

## 2023-07-26 DIAGNOSIS — Z0000 Encounter for general adult medical examination without abnormal findings: Secondary | ICD-10-CM

## 2023-07-26 DIAGNOSIS — E039 Hypothyroidism, unspecified: Secondary | ICD-10-CM

## 2023-07-26 LAB — CBC WITH DIFFERENTIAL/PLATELET
Basophils Absolute: 0.1 10*3/uL (ref 0.0–0.1)
Basophils Relative: 0.9 % (ref 0.0–3.0)
Eosinophils Absolute: 0.2 10*3/uL (ref 0.0–0.7)
Eosinophils Relative: 3.4 % (ref 0.0–5.0)
HCT: 41.8 % (ref 36.0–46.0)
Hemoglobin: 14.1 g/dL (ref 12.0–15.0)
Lymphocytes Relative: 35 % (ref 12.0–46.0)
Lymphs Abs: 1.9 10*3/uL (ref 0.7–4.0)
MCHC: 33.7 g/dL (ref 30.0–36.0)
MCV: 94.4 fL (ref 78.0–100.0)
Monocytes Absolute: 0.5 10*3/uL (ref 0.1–1.0)
Monocytes Relative: 9.7 % (ref 3.0–12.0)
Neutro Abs: 2.8 10*3/uL (ref 1.4–7.7)
Neutrophils Relative %: 51 % (ref 43.0–77.0)
Platelets: 327 10*3/uL (ref 150.0–400.0)
RBC: 4.43 Mil/uL (ref 3.87–5.11)
RDW: 13 % (ref 11.5–15.5)
WBC: 5.6 10*3/uL (ref 4.0–10.5)

## 2023-07-26 LAB — LIPID PANEL
Cholesterol: 226 mg/dL — ABNORMAL HIGH (ref 0–200)
HDL: 48.9 mg/dL (ref >39.00)
LDL Cholesterol: 144 mg/dL — ABNORMAL HIGH (ref 0–99)
NonHDL: 177.49
Total CHOL/HDL Ratio: 5
Triglycerides: 168 mg/dL — ABNORMAL HIGH (ref 0.0–149.0)
VLDL: 33.6 mg/dL (ref 0.0–40.0)

## 2023-07-26 LAB — COMPREHENSIVE METABOLIC PANEL
ALT: 13 U/L (ref 0–35)
AST: 15 U/L (ref 0–37)
Albumin: 4.4 g/dL (ref 3.5–5.2)
Alkaline Phosphatase: 62 U/L (ref 39–117)
BUN: 17 mg/dL (ref 6–23)
CO2: 31 meq/L (ref 19–32)
Calcium: 9.6 mg/dL (ref 8.4–10.5)
Chloride: 102 meq/L (ref 96–112)
Creatinine, Ser: 0.86 mg/dL (ref 0.40–1.20)
GFR: 63.74 mL/min (ref >60.00)
Glucose, Bld: 93 mg/dL (ref 70–99)
Potassium: 3.7 meq/L (ref 3.5–5.1)
Sodium: 141 meq/L (ref 135–145)
Total Bilirubin: 0.8 mg/dL (ref 0.2–1.2)
Total Protein: 7.3 g/dL (ref 6.0–8.3)

## 2023-07-26 LAB — TSH: TSH: 2.3 u[IU]/mL (ref 0.35–5.50)

## 2023-07-26 LAB — VITAMIN D 25 HYDROXY (VIT D DEFICIENCY, FRACTURES): VITD: 27.4 ng/mL — ABNORMAL LOW (ref 30.00–100.00)

## 2023-08-03 ENCOUNTER — Encounter: Payer: Self-pay | Admitting: Family

## 2023-08-03 ENCOUNTER — Ambulatory Visit: Payer: Medicare Other | Admitting: Family

## 2023-08-03 VITALS — BP 120/62 | HR 66 | Temp 97.6°F | Ht 62.0 in | Wt 137.6 lb

## 2023-08-03 DIAGNOSIS — Z1231 Encounter for screening mammogram for malignant neoplasm of breast: Secondary | ICD-10-CM

## 2023-08-03 DIAGNOSIS — Z0000 Encounter for general adult medical examination without abnormal findings: Secondary | ICD-10-CM

## 2023-08-03 DIAGNOSIS — E039 Hypothyroidism, unspecified: Secondary | ICD-10-CM | POA: Diagnosis not present

## 2023-08-03 DIAGNOSIS — Z13820 Encounter for screening for osteoporosis: Secondary | ICD-10-CM | POA: Diagnosis not present

## 2023-08-03 DIAGNOSIS — E2839 Other primary ovarian failure: Secondary | ICD-10-CM

## 2023-08-03 NOTE — Progress Notes (Signed)
Carol Lambert is a 82 y.o. female with the following history as recorded in EpicCare:  Patient Active Problem List   Diagnosis Date Noted   Endometrial cancer (HCC) 06/09/2017   Hearing loss 06/09/2017   Essential hypertension 04/21/2015    Current Outpatient Medications  Medication Sig Dispense Refill   ibuprofen (ADVIL,MOTRIN) 800 MG tablet Take 1 tablet (800 mg total) every 8 (eight) hours as needed by mouth (mild pain). 30 tablet 0   levothyroxine (SYNTHROID) 88 MCG tablet Take 1 tablet (88 mcg total) by mouth daily before breakfast. 90 tablet 0   triamterene-hydrochlorothiazide (MAXZIDE-25) 37.5-25 MG tablet Take 1 tablet by mouth daily. 90 tablet 1   No current facility-administered medications for this visit.    Allergies: Patient has no known allergies.  Past Medical History:  Diagnosis Date   Cancer [facility])    endometrial cancer    Complication of anesthesia    about 15 years ago , reports BP dropepd very low at that time    Hypothyroidism    Syncope    2 weeks ago ; was taking an antidepressant and wine together; did not get instructuons not to    Thyroid disease     Past Surgical History:  Procedure Laterality Date   BRACHIOPLASTY     bilateral  UE    CATARACT EXTRACTION     15 years ago    KNEE SURGERY Right    meniscus tear repair    LENS MATERIAL REMOVAL     ROBOTIC ASSISTED TOTAL HYSTERECTOMY WITH BILATERAL SALPINGO OOPHERECTOMY Bilateral 07/11/2017   Procedure: XI ROBOTIC ASSISTED TOTAL HYSTERECTOMY WITH BILATERAL SALPINGO OOPHORECTOMY WITH SENTINAL LYMPH NODES;  Surgeon: Adolphus Birchwood, MD;  Location: [facility];  Service: Gynecology;  Laterality: Bilateral;    Family History  Problem Relation Age of Onset   Diabetes Mother    Heart failure Father    Breast cancer Maternal Aunt    Cancer Maternal Aunt     Social History   Tobacco Use   Smoking status: Never   Smokeless tobacco: Never  Substance Use Topics   Alcohol use: Yes    Alcohol/week: 7.0  standard drinks of alcohol    Types: 7 Glasses of wine per week    Subjective:   Presents for yearly CPE; had labs done last week- needs to review;  Is concerned to see her lipids are up this year- admits has been eating more processed food this year; not exercising regularly- "just don't want to do it." Overall, feeling good;   Review of Systems  Constitutional: Negative.   HENT: Negative.    Eyes: Negative.   Respiratory: Negative.    Cardiovascular: Negative.   Gastrointestinal: Negative.   Genitourinary: Negative.   Musculoskeletal: Negative.   Skin: Negative.   Neurological: Negative.   Endo/Heme/Allergies: Negative.   Psychiatric/Behavioral: Negative.       Objective:  Vitals:   08/03/23 1316  BP: 120/62  Pulse: 66  Temp: 97.6 F (36.4 C)  TempSrc: Oral  SpO2: 94%  Weight: 137 lb 9.6 oz (62.4 kg)  Height: 5\' 2"  (1.575 m)    General: Well developed, well nourished, in no acute distress  Skin : Warm and dry.  Head: Normocephalic and atraumatic  Eyes: Sclera and conjunctiva clear; pupils round and reactive to light; extraocular movements intact  Ears: External normal; canals clear; tympanic membranes normal  Oropharynx: Pink, supple. No suspicious lesions  Neck: Supple without thyromegaly, adenopathy  Lungs: Respirations unlabored; clear to auscultation bilaterally  without wheeze, rales, rhonchi  CVS exam: normal rate and regular rhythm.  Abdomen: Soft; nontender; nondistended; normoactive bowel sounds; no masses or hepatosplenomegaly  Musculoskeletal: No deformities; no active joint inflammation  Extremities: No edema, cyanosis, clubbing  Vessels: Symmetric bilaterally  Neurologic: Alert and oriented; speech intact; face symmetrical; moves all extremities well; CNII-XII intact without focal deficit   Assessment:  1. PE (physical exam), annual   2. Visit for screening mammogram   3. Ovarian failure   4. Osteoporosis screening   5. Hypothyroidism,  unspecified type     Plan:  Age appropriate preventive healthcare needs addressed; encouraged regular eye doctor and dental exams; encouraged regular exercise; reviewed labs done prior and will plan to re-check lipid panel in 6 months;  Order for mammogram and DEXA; congratulated patient on her commitment to her health.   Return in about 6 months (around 02/01/2024) for fasting labs.  Orders Placed This Encounter  Procedures   MM Digital Screening    Standing Status:   Future    Standing Expiration Date:   08/02/2024    Scheduling Instructions:     Please schedule with DEXA    Order Specific Question:   Reason for Exam (SYMPTOM  OR DIAGNOSIS REQUIRED)    Answer:   screening mammogram    Order Specific Question:   Preferred imaging location?    Answer:   [facility]   DG Bone Density    Standing Status:   Future    Standing Expiration Date:   08/02/2024    Scheduling Instructions:     Schedule with mammogram    Order Specific Question:   Reason for Exam (SYMPTOM  OR DIAGNOSIS REQUIRED)    Answer:   ovarian failure/ osteoporosis screening    Order Specific Question:   Preferred imaging location?    Answer[facility]    Requested Prescriptions    No prescriptions requested or ordered in this encounter

## 2023-08-03 NOTE — Progress Notes (Signed)
Reviewed with patient at time of OV;

## 2023-08-04 ENCOUNTER — Other Ambulatory Visit: Payer: Self-pay | Admitting: Family

## 2023-09-13 ENCOUNTER — Encounter (HOSPITAL_BASED_OUTPATIENT_CLINIC_OR_DEPARTMENT_OTHER): Payer: Self-pay

## 2023-09-13 ENCOUNTER — Ambulatory Visit (HOSPITAL_BASED_OUTPATIENT_CLINIC_OR_DEPARTMENT_OTHER)
Admission: RE | Admit: 2023-09-13 | Discharge: 2023-09-13 | Disposition: A | Discharge status: Home / Self Care | Payer: Medicare Other | Source: Ambulatory Visit | Attending: Family | Admitting: Family

## 2023-09-13 DIAGNOSIS — Z13820 Encounter for screening for osteoporosis: Secondary | ICD-10-CM | POA: Diagnosis not present

## 2023-09-13 DIAGNOSIS — Z780 Asymptomatic menopausal state: Secondary | ICD-10-CM | POA: Diagnosis not present

## 2023-09-13 DIAGNOSIS — Z1231 Encounter for screening mammogram for malignant neoplasm of breast: Secondary | ICD-10-CM | POA: Diagnosis not present

## 2023-09-13 DIAGNOSIS — E2839 Other primary ovarian failure: Secondary | ICD-10-CM | POA: Diagnosis not present

## 2023-09-13 DIAGNOSIS — M85852 Other specified disorders of bone density and structure, left thigh: Secondary | ICD-10-CM | POA: Diagnosis not present

## 2023-09-15 ENCOUNTER — Encounter: Payer: Self-pay | Admitting: Family

## 2023-10-10 DIAGNOSIS — H52203 Unspecified astigmatism, bilateral: Secondary | ICD-10-CM | POA: Diagnosis not present

## 2023-10-17 DIAGNOSIS — L244 Irritant contact dermatitis due to drugs in contact with skin: Secondary | ICD-10-CM | POA: Diagnosis not present

## 2023-10-17 DIAGNOSIS — L820 Inflamed seborrheic keratosis: Secondary | ICD-10-CM | POA: Diagnosis not present

## 2023-10-24 ENCOUNTER — Ambulatory Visit (INDEPENDENT_AMBULATORY_CARE_PROVIDER_SITE_OTHER): Payer: Medicare Other

## 2023-10-24 VITALS — Ht 62.0 in | Wt 137.0 lb

## 2023-10-24 DIAGNOSIS — Z0000 Encounter for general adult medical examination without abnormal findings: Secondary | ICD-10-CM | POA: Diagnosis not present

## 2023-10-24 NOTE — Progress Notes (Signed)
 Subjective:   Carol Lambert is a 83 y.o. female who presents for Medicare Annual (Subsequent) preventive examination.  Visit Complete: Virtual I connected with  Carol Lambert on 10/24/23 by a audio enabled telemedicine application and verified that I am speaking with the correct person using two identifiers.  Patient Location: Home  Provider Location: Home Office  I discussed the limitations of evaluation and management by telemedicine. The patient expressed understanding and agreed to proceed.  Vital Signs: Because this visit was a virtual/telehealth visit, some criteria may be missing or patient reported. Any vitals not documented were not able to be obtained and vitals that have been documented are patient reported.  Patient Medicare AWV questionnaire was completed by the patient on 09/17/23; I have confirmed that all information answered by patient is correct and no changes since this date.  Cardiac Risk Factors include: advanced age (>28men, >62 women);hypertension     Objective:    Today's Vitals   10/24/23 0903  Weight: 137 lb (62.1 kg)  Height: 5\' 2"  (1.575 m)   Body mass index is 25.06 kg/m.     10/24/2023    9:10 AM 10/05/2022    1:02 PM 07/16/2018    1:32 PM 01/03/2018    1:23 PM 08/02/2017   11:57 AM 07/11/2017    7:00 PM 07/10/2017    2:27 PM  Advanced Directives  Does Patient Have a Medical Advance Directive? Yes Yes Yes Yes Yes Yes Yes  Type of Estate agent of [place];Living will Healthcare Power of [place];Living will Living will Healthcare Power of [employer] Power of [place];Living will Living will Living will  Does patient want to make changes to medical advance directive?   No - Patient declined   No - Patient declined No - Patient declined  Copy of Healthcare Power of Attorney in Chart? No - copy requested No - copy requested  No - copy requested No - copy requested      Current Medications (verified) Outpatient  Encounter Medications as of 10/24/2023  Medication Sig   ibuprofen (ADVIL,MOTRIN) 800 MG tablet Take 1 tablet (800 mg total) every 8 (eight) hours as needed by mouth (mild pain).   levothyroxine (SYNTHROID) 88 MCG tablet TAKE 1 TABLET (88 MCG TOTAL) BY MOUTH DAILY BEFORE BREAKFAST.   triamterene-hydrochlorothiazide (MAXZIDE-25) 37.5-25 MG tablet Take 1 tablet by mouth daily.   No facility-administered encounter medications on file as of 10/24/2023.    Allergies (verified) Patient has no known allergies.   History: Past Medical History:  Diagnosis Date   Cancer (HCC)    endometrial cancer    Complication of anesthesia    about 15 years ago , reports BP dropepd very low at that time    Hypothyroidism    Syncope    2 weeks ago ; was taking an antidepressant and wine together; did not get instructuons not to    Thyroid disease    Past Surgical History:  Procedure Laterality Date   BRACHIOPLASTY     bilateral  UE    CATARACT EXTRACTION     15 years ago    KNEE SURGERY Right    meniscus tear repair    LENS MATERIAL REMOVAL     ROBOTIC ASSISTED TOTAL HYSTERECTOMY WITH BILATERAL SALPINGO OOPHERECTOMY Bilateral 07/11/2017   Procedure: XI ROBOTIC ASSISTED TOTAL HYSTERECTOMY WITH BILATERAL SALPINGO OOPHORECTOMY WITH SENTINAL LYMPH NODES;  Surgeon: Adolphus Birchwood, MD;  Location: [facility];  Service: Gynecology;  Laterality: Bilateral;   Family History  Problem Relation Age of Onset   Diabetes Mother    Heart failure Father    Breast cancer Maternal Aunt    Cancer Maternal Aunt    Social History   Socioeconomic History   Marital status: Married    Spouse name: Not on file   Number of children: Not on file   Years of education: Not on file   Highest education level: Master's degree (e.g., MA, MS, MEng, MEd, MSW, MBA)  Occupational History   Not on file  Tobacco Use   Smoking status: Never   Smokeless tobacco: Never  Vaping Use   Vaping status: Never Used  Substance and Sexual  Activity   Alcohol use: Yes    Alcohol/week: 7.0 standard drinks of alcohol    Types: 7 Glasses of wine per week   Drug use: No   Sexual activity: Not on file  Other Topics Concern   Not on file  Social History Narrative   Not on file   Social Drivers of Health   Financial Resource Strain: Low Risk  (10/24/2023)   Overall Financial Resource Strain (CARDIA)    Difficulty of Paying Living Expenses: Not hard at all  Food Insecurity: No Food Insecurity (10/24/2023)   Hunger Vital Sign    Worried About Running Out of Food in the Last Year: Never true    Ran Out of Food in the Last Year: Never true  Transportation Needs: No Transportation Needs (10/24/2023)   PRAPARE - Administrator, Civil Service (Medical): No    Lack of Transportation (Non-Medical): No  Physical Activity: Insufficiently Active (10/24/2023)   Exercise Vital Sign    Days of Exercise per Week: 4 days    Minutes of Exercise per Session: 30 min  Stress: No Stress Concern Present (10/24/2023)   [employer] of Occupational Health - Occupational Stress Questionnaire    Feeling of Stress : Not at all  Social Connections: Socially Integrated (10/24/2023)   Social Connection and Isolation Panel [NHANES]    Frequency of Communication with Friends and Family: More than three times a week    Frequency of Social Gatherings with Friends and Family: Three times a week    Attends Religious Services: 1 to 4 times per year    Active Member of Clubs or Organizations: Yes    Attends Banker Meetings: 1 to 4 times per year    Marital Status: Married    Tobacco Counseling Counseling given: Not Answered   Clinical Intake:  Pre-visit preparation completed: Yes  Pain : No/denies pain     BMI - recorded: 25.06 Nutritional Status: BMI 25 -29 Overweight Nutritional Risks: None Diabetes: No  How often do you need to have someone help you when you read instructions, pamphlets, or other written materials  from your doctor or pharmacy?: 1 - Never  Interpreter Needed?: No  Information entered by :: Theresa Mulligan LPN   Activities of Daily Living    10/24/2023    9:08 AM 10/18/2023   10:01 AM  In your present state of health, do you have any difficulty performing the following activities:  Hearing? 1 0  Comment Wears Hearing Aids   Vision? 0 0  Difficulty concentrating or making decisions? 0 0  Walking or climbing stairs? 0 0  Dressing or bathing? 0 0  Doing errands, shopping? 0 0  Preparing Food and eating ? N N  Using the Toilet? N N  In the past six months, have you  accidently leaked urine? Malvin Johns  Comment Wears Pads. Followed by PCP   Do you have problems with loss of bowel control? N N  Managing your Medications? N N  Managing your Finances? N N  Housekeeping or managing your Housekeeping? N N    Patient Care Team: Olive Bass, FNP as PCP - General (Internal Medicine)  Indicate any recent Medical Services you may have received from other than [facility] providers in the past year (date may be approximate).     Assessment:   This is a routine wellness examination for [place].  Hearing/Vision screen Hearing Screening - Comments:: Wears Hearing Aids Vision Screening - Comments:: Wears rx glasses - up to date with routine eye exams with  Dr Jackquline Bosch   Goals Addressed               This Visit's Progress     No current goals (pt-stated)         Depression Screen    10/24/2023    9:15 AM 08/03/2023    2:00 PM 10/05/2022    1:01 PM  PHQ 2/9 Scores  PHQ - 2 Score 0 0 0    Fall Risk    10/24/2023    9:09 AM 10/18/2023   10:01 AM 08/03/2023    2:00 PM 10/04/2022   12:03 PM 06/24/2022   10:11 AM  Fall Risk   Falls in the past year? 0 0 0 1 1  Number falls in past yr: 0 0 0 0 1  Injury with Fall? 0  0 0 1  Risk for fall due to : No Fall Risks  No Fall Risks Impaired vision Impaired balance/gait;Other (Comment)  Risk for fall due to: Comment     caused by out  reasons  Follow up Falls prevention discussed;Falls evaluation completed  Falls evaluation completed Falls prevention discussed Falls evaluation completed;Falls prevention discussed    MEDICARE RISK AT HOME: Medicare Risk at Home Any stairs in or around the home?: Yes If so, are there any without handrails?: No Home free of loose throw rugs in walkways, pet beds, electrical cords, etc?: Yes Adequate lighting in your home to reduce risk of falls?: Yes Life alert?: No Use of a cane, walker or w/c?: No Grab bars in the bathroom?: Yes Shower chair or bench in shower?: No Elevated toilet seat or a handicapped toilet?: No  TIMED UP AND GO:  Was the test performed?  No    Cognitive Function:        10/24/2023    9:10 AM 10/05/2022    1:04 PM  6CIT Screen  What Year? 0 points 0 points  What month? 0 points 0 points  What time? 0 points 0 points  Count back from 20 0 points 0 points  Months in reverse 0 points 4 points  Repeat phrase 0 points 0 points  Total Score 0 points 4 points    Immunizations  There is no immunization history on file for this patient.  TDAP status: Due, Education has been provided regarding the importance of this vaccine. Advised may receive this vaccine at local pharmacy or Health Dept. Aware to provide a copy of the vaccination record if obtained from local pharmacy or Health Dept. Verbalized acceptance and understanding.  Flu Vaccine status: Due, Education has been provided regarding the importance of this vaccine. Advised may receive this vaccine at local pharmacy or Health Dept. Aware to provide a copy of the vaccination record if obtained from  local pharmacy or Health Dept. Verbalized acceptance and understanding.   Screening Tests Health Maintenance  Topic Date Due   DTaP/Tdap/Td (1 - Tdap) Never done   INFLUENZA VACCINE  11/27/2023 (Originally 03/30/2023)   Medicare Annual Wellness (AWV)  10/23/2024   DEXA SCAN  Completed   HPV VACCINES  Aged Out    Pneumonia Vaccine 67+ Years old  Discontinued   COVID-19 Vaccine  Discontinued   Zoster Vaccines- Shingrix  Discontinued    Health Maintenance  Health Maintenance Due  Topic Date Due   DTaP/Tdap/Td (1 - Tdap) Never done        Bone Density status: Completed 09/13/23. Results reflect: Bone density results: OSTEOPENIA. Repeat every   years.     Additional Screening:    Vision Screening: Recommended annual ophthalmology exams for early detection of glaucoma and other disorders of the eye. Is the patient up to date with their annual eye exam?  Yes  Who is the provider or what is the name of the office in which the patient attends annual eye exams? Dr  Jackquline Bosch If pt is not established with a provider, would they like to be referred to a provider to establish care? No .   Dental Screening: Recommended annual dental exams for proper oral hygiene    Community Resource Referral / Chronic Care Management:  CRR required this visit?  No   CCM required this visit?  No     Plan:     I have personally reviewed and noted the following in the patient's chart:   Medical and social history Use of alcohol, tobacco or illicit drugs  Current medications and supplements including opioid prescriptions. Patient is not currently taking opioid prescriptions. Functional ability and status Nutritional status Physical activity Advanced directives List of other physicians Hospitalizations, surgeries, and ER visits in previous 12 months Vitals Screenings to include cognitive, depression, and falls Referrals and appointments  In addition, I have reviewed and discussed with patient certain preventive protocols, quality metrics, and best practice recommendations. A written personalized care plan for preventive services as well as general preventive health recommendations were provided to patient.     Tillie Rung, LPN   9/52/[id number]   After Visit Summary: (MyChart) Due to this  being a telephonic visit, the after visit summary with patients personalized plan was offered to patient via MyChart   Nurse Notes: None

## 2023-10-24 NOTE — Patient Instructions (Addendum)
 Carol Lambert , Thank you for taking time to come for your Medicare Wellness Visit. I appreciate your ongoing commitment to your health goals. Please review the following plan we discussed and let me know if I can assist you in the future.   Referrals/Orders/Follow-Ups/Clinician Recommendations:   This is a list of the screening recommended for you and due dates:  Health Maintenance  Topic Date Due   DTaP/Tdap/Td vaccine (1 - Tdap) Never done   Flu Shot  11/27/2023*   Medicare Annual Wellness Visit  10/23/2024   DEXA scan (bone density measurement)  Completed   HPV Vaccine  Aged Out   Pneumonia Vaccine  Discontinued   COVID-19 Vaccine  Discontinued   Zoster (Shingles) Vaccine  Discontinued  *Topic was postponed. The date shown is not the original due date.    Advanced directives: (Copy Requested) Please bring a copy of your health care power of attorney and living will to the office to be added to your chart at your convenience.  Next Medicare Annual Wellness Visit scheduled for next year: Yes

## 2024-01-24 ENCOUNTER — Other Ambulatory Visit: Payer: Self-pay | Admitting: Family

## 2024-01-31 ENCOUNTER — Telehealth: Payer: Self-pay | Admitting: Family

## 2024-01-31 DIAGNOSIS — I10 Essential (primary) hypertension: Secondary | ICD-10-CM

## 2024-01-31 NOTE — Telephone Encounter (Signed)
 Good evening I need lab orders for this pt for 6/5

## 2024-01-31 NOTE — Telephone Encounter (Signed)
 Chart reviewed- Pt needing FLP, order placed.

## 2024-02-01 ENCOUNTER — Ambulatory Visit: Payer: Self-pay | Admitting: Family

## 2024-02-01 ENCOUNTER — Other Ambulatory Visit (INDEPENDENT_AMBULATORY_CARE_PROVIDER_SITE_OTHER): Payer: Medicare Other

## 2024-02-01 DIAGNOSIS — I10 Essential (primary) hypertension: Secondary | ICD-10-CM

## 2024-02-01 LAB — LIPID PANEL
Cholesterol: 205 mg/dL — ABNORMAL HIGH (ref 0–200)
HDL: 51.1 mg/dL (ref >39.00)
LDL Cholesterol: 121 mg/dL — ABNORMAL HIGH (ref 0–99)
NonHDL: 153.84
Total CHOL/HDL Ratio: 4
Triglycerides: 162 mg/dL — ABNORMAL HIGH (ref 0.0–149.0)
VLDL: 32.4 mg/dL (ref 0.0–40.0)

## 2024-03-11 ENCOUNTER — Encounter: Payer: Self-pay | Admitting: Physician Assistant

## 2024-03-11 ENCOUNTER — Ambulatory Visit: Payer: Self-pay

## 2024-03-11 ENCOUNTER — Ambulatory Visit (INDEPENDENT_AMBULATORY_CARE_PROVIDER_SITE_OTHER): Admitting: Physician Assistant

## 2024-03-11 VITALS — BP 120/65 | HR 64 | Ht 62.0 in | Wt 138.4 lb

## 2024-03-11 DIAGNOSIS — N3000 Acute cystitis without hematuria: Secondary | ICD-10-CM | POA: Diagnosis not present

## 2024-03-11 DIAGNOSIS — R300 Dysuria: Secondary | ICD-10-CM

## 2024-03-11 LAB — POCT URINALYSIS DIP (MANUAL ENTRY)
Bilirubin, UA: NEGATIVE
Blood, UA: NEGATIVE
Glucose, UA: NEGATIVE mg/dL
Ketones, POC UA: NEGATIVE mg/dL
Nitrite, UA: POSITIVE — AB
Spec Grav, UA: 1.01 (ref 1.010–1.025)
Urobilinogen, UA: 0.2 U/dL
pH, UA: 6 (ref 5.0–8.0)

## 2024-03-11 MED ORDER — CEPHALEXIN 500 MG PO CAPS: 500.0000 mg | ORAL_CAPSULE | Freq: Two times a day (BID) | ORAL | 0 refills | Status: AC

## 2024-03-11 NOTE — Telephone Encounter (Signed)
 FYI Only or Action Required?: FYI only for provider.  Patient was last seen in primary care on 08/03/2023 by Jason Leita Repine, FNP.  Called Nurse Triage reporting Urinary Frequency.  Symptoms began several days ago.  Interventions attempted: Nothing.  Symptoms are: unchanged.  Triage Disposition: See Physician Within 24 Hours  Patient/caregiver understands and will follow disposition?: Yes, will follow disposition  Copied from CRM [id number]. Topic: Clinical - Red Word Triage >> Mar 11, 2024  7:58 AM Robinson H wrote: [facility] that prompted transfer to Nurse Triage: Burning and frequent urination, discomfort Reason for Disposition  Urinating more frequently than usual (i.e., frequency) OR new-onset of the feeling of an urgent need to urinate (i.e., urgency)  Answer Assessment - Initial Assessment Questions 1. SYMPTOM: What's the main symptom you're concerned about? (e.g., frequency, incontinence)     Frequency and burning 2. ONSET: When did the  frequency and burning  start?     3 days 3. PAIN: Is there any pain? If Yes, ask: How bad is it? (Scale: 1-10; mild, moderate, severe)     Burning, 8 4. CAUSE: What do you think is causing the symptoms?     UTI 5. OTHER SYMPTOMS: Do you have any other symptoms? (e.g., blood in urine, fever, flank pain, pain with urination)     denies  Protocols used: Urinary Symptoms-A-AH

## 2024-03-11 NOTE — Progress Notes (Signed)
 Established patient visit   Patient: Carol Lambert   DOB: May 04, 1941   83 y.o. Female  MRN: [id number] Visit Date: 03/11/2024  Today's healthcare provider: Manuelita Flatness, PA-C   Chief Complaint  Patient presents with   Urinary Tract Infection    Symptoms started Friday   Subjective     Pt reports dysuria, urgency, frequency x 3-4 days. Denies hematuria, abdominal pain, back pain. She believes it was related recent sexual activity.  Medications: Outpatient Medications Prior to Visit  Medication Sig   ibuprofen  (ADVIL ,MOTRIN ) 800 MG tablet Take 1 tablet (800 mg total) every 8 (eight) hours as needed by mouth (mild pain).   levothyroxine  (SYNTHROID ) 88 MCG tablet TAKE 1 TABLET (88 MCG TOTAL) BY MOUTH DAILY BEFORE BREAKFAST.   triamterene -hydrochlorothiazide (MAXZIDE-25) 37.5-25 MG tablet Take 1 tablet by mouth daily.   No facility-administered medications prior to visit.    Review of Systems  Constitutional:  Negative for fatigue and fever.  Respiratory:  Negative for cough and shortness of breath.   Cardiovascular:  Negative for chest pain and leg swelling.  Gastrointestinal:  Negative for abdominal pain.  Genitourinary:  Positive for dysuria, frequency and urgency.  Neurological:  Negative for dizziness and headaches.       Objective    BP 120/65   Pulse 64   Ht 5' 2 (1.575 m)   Wt 138 lb 6.4 oz (62.8 kg)   BMI 25.31 kg/m    Physical Exam Constitutional:      General: She is awake.     Appearance: She is well-developed.  HENT:     Head: Normocephalic.  Eyes:     Conjunctiva/sclera: Conjunctivae normal.  Cardiovascular:     Rate and Rhythm: Normal rate and regular rhythm.     Heart sounds: Normal heart sounds.  Pulmonary:     Effort: Pulmonary effort is normal.     Breath sounds: Normal breath sounds.  Abdominal:     Tenderness: There is no right CVA tenderness or left CVA tenderness.  Skin:    General: Skin is warm.  Neurological:     Mental  Status: She is alert and oriented to person, place, and time.  Psychiatric:        Attention and Perception: Attention normal.        Mood and Affect: Mood normal.        Speech: Speech normal.        Behavior: Behavior is cooperative.       Results for orders placed or performed in visit on 03/11/24  POCT urinalysis dipstick  Result Value Ref Range   Color, UA yellow yellow   Clarity, UA cloudy (A) clear   Glucose, UA negative negative mg/dL   Bilirubin, UA negative negative   Ketones, POC UA negative negative mg/dL   Spec Grav, UA 8.989 8.989 - 1.025   Blood, UA negative negative   pH, UA 6.0 5.0 - 8.0   Protein Ur, POC trace (A) negative mg/dL   Urobilinogen, UA 0.2 0.2 or 1.0 E.U./dL   Nitrite, UA Positive (A) Negative   Leukocytes, UA Large (3+) (A) Negative    Assessment & Plan    Acute cystitis without hematuria -     Cephalexin ; Take 1 capsule (500 mg total) by mouth 2 (two) times daily for 5 days.  Dispense: 10 capsule; Refill: 0 -     Urine Culture  Dysuria -     POCT urinalysis dipstick  UA today +  leuks, nitrites.   Rx keflex  500 mg bid x 5 days Encourage fluids   Return if symptoms worsen or fail to improve.       Manuelita Flatness, PA-C  [facility] at [facility] [phone] (phone) [phone] (fax)  [facility]

## 2024-03-11 NOTE — Telephone Encounter (Signed)
Pt has scheduled office visit.

## 2024-03-13 ENCOUNTER — Ambulatory Visit: Payer: Self-pay | Admitting: Physician Assistant

## 2024-03-13 LAB — URINE CULTURE
MICRO NUMBER:: 16694572
SPECIMEN QUALITY:: ADEQUATE

## 2024-05-01 DIAGNOSIS — H40013 Open angle with borderline findings, low risk, bilateral: Secondary | ICD-10-CM | POA: Diagnosis not present

## 2024-05-01 DIAGNOSIS — H35369 Drusen (degenerative) of macula, unspecified eye: Secondary | ICD-10-CM | POA: Diagnosis not present

## 2024-05-01 DIAGNOSIS — Z961 Presence of intraocular lens: Secondary | ICD-10-CM | POA: Diagnosis not present

## 2024-05-01 DIAGNOSIS — H4301 Vitreous prolapse, right eye: Secondary | ICD-10-CM | POA: Diagnosis not present

## 2024-05-18 ENCOUNTER — Other Ambulatory Visit: Payer: Self-pay | Admitting: Family

## 2024-05-21 ENCOUNTER — Other Ambulatory Visit: Payer: Self-pay | Admitting: Family

## 2024-05-21 MED ORDER — TRIAMTERENE-HCTZ 37.5-25 MG PO TABS: 1.0000 | ORAL_TABLET | Freq: Every day | ORAL | 0 refills | Status: DC

## 2024-05-21 NOTE — Telephone Encounter (Unsigned)
 Copied from CRM [id number]. Topic: Clinical - Medication Refill >> May 21, 2024 10:50 AM Martinique E wrote: Medication: triamterene -hydrochlorothiazide (MAXZIDE-25) 37.5-25 MG tablet  Has the patient contacted their pharmacy? Yes (Agent: If no, request that the patient contact the pharmacy for the refill. If patient does not wish to contact the pharmacy document the reason why and proceed with request.) (Agent: If yes, when and what did the pharmacy advise?)  This is the patient's preferred pharmacy:  [facility] [place], [place] - [address] [place] [address] Phone: [phone] Fax: [phone]  Is this the correct pharmacy for this prescription? Yes If no, delete pharmacy and type the correct one.   Has the prescription been filled recently? No  Is the patient out of the medication? Yes, patient has her [facility] appointment in December, as her previous provider has left the practice.  Has the patient been seen for an appointment in the last year OR does the patient have an upcoming appointment? Yes  Can we respond through MyChart? Yes  Agent: Please be advised that Rx refills may take up to 3 business days. We ask that you follow-up with your pharmacy.

## 2024-05-22 ENCOUNTER — Telehealth: Payer: Self-pay

## 2024-05-22 NOTE — Telephone Encounter (Signed)
 Copied from CRM #[id number]. Topic: Clinical - Medication Question >> May 22, 2024 11:57 AM Carol Lambert wrote: Reason for CRM: Patient is calling in to make sure her refills would be accepted up until her appointment 12/09, patient is scheduled for the first available and is on the wait list. Patient did receive her last refill but it was only for 30 days, instead of 90. Please advise patient if this would be an issue

## 2024-05-22 NOTE — Telephone Encounter (Signed)
 Padonda Webb covering for prescriptions until Pt can establish w/ new provider.

## 2024-06-20 ENCOUNTER — Other Ambulatory Visit: Payer: Self-pay | Admitting: Family

## 2024-06-20 NOTE — Telephone Encounter (Unsigned)
 Copied from CRM [id number]. Topic: Clinical - Medication Refill >> Jun 20, 2024  4:04 PM Jasmin G wrote: Medication: triamterene -hydrochlorothiazide (MAXZIDE-25) 37.5-25 MG tablet. Pt requested for a 60 day refill.  Has the patient contacted their pharmacy? No (Agent: If no, request that the patient contact the pharmacy for the refill. If patient does not wish to contact the pharmacy document the reason why and proceed with request.) (Agent: If yes, when and what did the pharmacy advise?)  This is the patient's preferred pharmacy:  [facility] [place], [place] - [address] [place] [address] Phone: [phone] Fax: [phone]  Is this the correct pharmacy for this prescription? Yes If no, delete pharmacy and type the correct one.   Has the prescription been filled recently? Yes  Is the patient out of the medication? Yes  Has the patient been seen for an appointment in the last year OR does the patient have an upcoming appointment? Yes  Can we respond through MyChart? Yes  Agent: Please be advised that Rx refills may take up to 3 business days. We ask that you follow-up with your pharmacy.

## 2024-06-21 MED ORDER — TRIAMTERENE-HCTZ 37.5-25 MG PO TABS: 1.0000 | ORAL_TABLET | Freq: Every day | ORAL | 0 refills | Status: DC

## 2024-08-02 DIAGNOSIS — K080 Exfoliation of teeth due to systemic causes: Secondary | ICD-10-CM | POA: Diagnosis not present

## 2024-08-06 ENCOUNTER — Ambulatory Visit: Admitting: Family Medicine

## 2024-08-06 ENCOUNTER — Encounter: Payer: Self-pay | Admitting: Family Medicine

## 2024-08-06 VITALS — BP 113/49 | HR 74 | Temp 97.5°F | Ht 62.5 in | Wt 138.0 lb

## 2024-08-06 DIAGNOSIS — I10 Essential (primary) hypertension: Secondary | ICD-10-CM | POA: Diagnosis not present

## 2024-08-06 DIAGNOSIS — E785 Hyperlipidemia, unspecified: Secondary | ICD-10-CM | POA: Diagnosis not present

## 2024-08-06 DIAGNOSIS — E039 Hypothyroidism, unspecified: Secondary | ICD-10-CM | POA: Insufficient documentation

## 2024-08-06 DIAGNOSIS — E559 Vitamin D deficiency, unspecified: Secondary | ICD-10-CM | POA: Diagnosis not present

## 2024-08-06 DIAGNOSIS — Z0000 Encounter for general adult medical examination without abnormal findings: Secondary | ICD-10-CM

## 2024-08-06 LAB — LIPID PANEL
Cholesterol: 193 mg/dL (ref 0–200)
HDL: 46.7 mg/dL (ref >39.00)
LDL Cholesterol: 116 mg/dL — ABNORMAL HIGH (ref 0–99)
NonHDL: 146.03
Total CHOL/HDL Ratio: 4
Triglycerides: 148 mg/dL (ref 0.0–149.0)
VLDL: 29.6 mg/dL (ref 0.0–40.0)

## 2024-08-06 LAB — VITAMIN D 25 HYDROXY (VIT D DEFICIENCY, FRACTURES): VITD: 28.41 ng/mL — ABNORMAL LOW (ref 30.00–100.00)

## 2024-08-06 LAB — CBC WITH DIFFERENTIAL/PLATELET
Basophils Absolute: 0 K/uL (ref 0.0–0.1)
Basophils Relative: 0.9 % (ref 0.0–3.0)
Eosinophils Absolute: 0.1 K/uL (ref 0.0–0.7)
Eosinophils Relative: 2.6 % (ref 0.0–5.0)
HCT: 39.2 % (ref 36.0–46.0)
Hemoglobin: 13.4 g/dL (ref 12.0–15.0)
Lymphocytes Relative: 25.5 % (ref 12.0–46.0)
Lymphs Abs: 0.9 K/uL (ref 0.7–4.0)
MCHC: 34.2 g/dL (ref 30.0–36.0)
MCV: 91.8 fl (ref 78.0–100.0)
Monocytes Absolute: 0.4 K/uL (ref 0.1–1.0)
Monocytes Relative: 12.5 % — ABNORMAL HIGH (ref 3.0–12.0)
Neutro Abs: 2 K/uL (ref 1.4–7.7)
Neutrophils Relative %: 58.5 % (ref 43.0–77.0)
Platelets: 266 K/uL (ref 150.0–400.0)
RBC: 4.27 Mil/uL (ref 3.87–5.11)
RDW: 12.9 % (ref 11.5–15.5)
WBC: 3.4 K/uL — ABNORMAL LOW (ref 4.0–10.5)

## 2024-08-06 LAB — TSH: TSH: 1.16 u[IU]/mL (ref 0.35–5.50)

## 2024-08-06 LAB — COMPREHENSIVE METABOLIC PANEL WITH GFR
ALT: 20 U/L (ref 0–35)
AST: 19 U/L (ref 0–37)
Albumin: 4.5 g/dL (ref 3.5–5.2)
Alkaline Phosphatase: 74 U/L (ref 39–117)
BUN: 13 mg/dL (ref 6–23)
CO2: 30 meq/L (ref 19–32)
Calcium: 9.6 mg/dL (ref 8.4–10.5)
Chloride: 101 meq/L (ref 96–112)
Creatinine, Ser: 0.73 mg/dL (ref 0.40–1.20)
GFR: 77.03 mL/min (ref >60.00)
Glucose, Bld: 89 mg/dL (ref 70–99)
Potassium: 3.7 meq/L (ref 3.5–5.1)
Sodium: 140 meq/L (ref 135–145)
Total Bilirubin: 0.7 mg/dL (ref 0.2–1.2)
Total Protein: 7.3 g/dL (ref 6.0–8.3)

## 2024-08-06 MED ORDER — TRIAMTERENE-HCTZ 37.5-25 MG PO TABS: 1.0000 | ORAL_TABLET | Freq: Every day | ORAL | 1 refills | Status: AC

## 2024-08-06 MED ORDER — LEVOTHYROXINE SODIUM 88 MCG PO TABS: 88.0000 ug | ORAL_TABLET | Freq: Every day | ORAL | 1 refills | Status: AC

## 2024-08-06 NOTE — Assessment & Plan Note (Signed)
Previously well controlled Continue Synthroid at current dose (88 mcg daily) Recheck TSH and adjust Synthroid as indicated

## 2024-08-06 NOTE — Assessment & Plan Note (Signed)
 Blood pressure is at goal for age and co-morbidities.   Recommendations: triamterene -hctz 37.5-25 mg daily - BP goal <130/80 - monitor and log blood pressures at home - check around the same time each day in a relaxed setting - Limit salt to <2000 mg/day - Follow DASH eating plan (heart healthy diet) - limit alcohol to 2 standard drinks per day for men and 1 per day for women - avoid tobacco products - get at least 2 hours of regular aerobic exercise weekly Patient aware of signs/symptoms requiring further/urgent evaluation. Labs updated today.

## 2024-08-06 NOTE — Progress Notes (Signed)
 New Patient Office Visit   Subjective     Patient ID: Carol Lambert, female   DOB: 1940-11-29  Age: 83 y.o. MRN: [id number]   CC:  Chief Complaint  Patient presents with   Transitions Of Care   Medication Refill    Prescription refills on Levothyroxine  Sodium & Triamterene -HCTZ      HPI Carol Lambert presents to establish/transfer care. She lives with her husband. She is retired from agricultural consultant. They lived all over the country - [place] , [place], [place] , [place] .     Hypertension: - Medications: Maxzide 37.5-25 mg daily.  - Compliance: good - Checking BP at home:  - Denies any SOB, recurrent headaches, CP, vision changes, LE edema, dizziness, palpitations, or medication side effects.   Hyperlipidemia: - medications: none - compliance: n/a - medication SEs: n/a The ASCVD Risk score (Arnett DK, et al., 2019) failed to calculate for the following reasons:   The 2019 ASCVD risk score is only valid for ages 69 to 35   Hypothyroidism: - Management: Levothyroxine  88 mcg daily.  -Taking medications as prescribed in the morning, apart from other foods, meds, vitamins, etc.  -No recent changes to hair, skin, nails, energy levels      Outpatient Medications Prior to Visit  Medication Sig   ibuprofen  (ADVIL ,MOTRIN ) 800 MG tablet Take 1 tablet (800 mg total) every 8 (eight) hours as needed by mouth (mild pain).   latanoprost (XALATAN) 0.005 % ophthalmic solution SMARTSIG:In Eye(s)   [DISCONTINUED] levothyroxine  (SYNTHROID ) 88 MCG tablet TAKE 1 TABLET (88 MCG TOTAL) BY MOUTH DAILY BEFORE BREAKFAST.   [DISCONTINUED] triamterene -hydrochlorothiazide (MAXZIDE-25) 37.5-25 MG tablet Take 1 tablet by mouth daily.   No facility-administered medications prior to visit.   Past Medical History:  Diagnosis Date   Arthritis    Cancer (HCC)    endometrial cancer    Complication of anesthesia    about 15 years ago , reports BP dropepd very low at that time    Hypothyroidism     Syncope    2 weeks ago ; was taking an antidepressant and wine together; did not get instructuons not to    Thyroid  disease     Past Surgical History:  Procedure Laterality Date   ABDOMINAL HYSTERECTOMY     BRACHIOPLASTY     bilateral  UE    BREAST SURGERY     CATARACT EXTRACTION     15 years ago    COSMETIC SURGERY     EYE SURGERY     KNEE SURGERY Right    meniscus tear repair    LENS MATERIAL REMOVAL     ROBOTIC ASSISTED TOTAL HYSTERECTOMY WITH BILATERAL SALPINGO OOPHERECTOMY Bilateral 07/11/2017   Procedure: XI ROBOTIC ASSISTED TOTAL HYSTERECTOMY WITH BILATERAL SALPINGO OOPHORECTOMY WITH SENTINAL LYMPH NODES;  Surgeon: Eloy Herring, MD;  Location: [facility];  Service: Gynecology;  Laterality: Bilateral;     Family History  Problem Relation Age of Onset   Diabetes Mother    Heart disease Mother    Heart failure Father    Heart disease Father    Breast cancer Maternal Aunt    Cancer Maternal Aunt     Social History   Socioeconomic History   Marital status: Married    Spouse name: Not on file   Number of children: Not on file   Years of education: Not on file   Highest education level: Master's degree (e.g., MA, MS, MEng, MEd, MSW, MBA)  Occupational History   Not on file  Tobacco Use   Smoking status: Never   Smokeless tobacco: Never  Vaping Use   Vaping status: Never Used  Substance and Sexual Activity   Alcohol use: Yes    Alcohol/week: 7.0 standard drinks of alcohol    Types: 7 Glasses of wine per week   Drug use: No   Sexual activity: Not on file  Other Topics Concern   Not on file  Social History Narrative   Not on file   Social Drivers of Health   Financial Resource Strain: Low Risk  (07/31/2024)   Overall Financial Resource Strain (CARDIA)    Difficulty of Paying Living Expenses: Not hard at all  Food Insecurity: Unknown (07/31/2024)   Hunger Vital Sign    Worried About Running Out of Food in the Last Year: Never true    Ran Out of Food in the  Last Year: Not on file  Transportation Needs: No Transportation Needs (07/31/2024)   PRAPARE - Administrator, Civil Service (Medical): No    Lack of Transportation (Non-Medical): No  Physical Activity: Insufficiently Active (07/31/2024)   Exercise Vital Sign    Days of Exercise per Week: 4 days    Minutes of Exercise per Session: 30 min  Stress: No Stress Concern Present (07/31/2024)   [employer] of Occupational Health - Occupational Stress Questionnaire    Feeling of Stress: Not at all  Social Connections: Moderately Integrated (07/31/2024)   Social Connection and Isolation Panel    Frequency of Communication with Friends and Family: Three times a week    Frequency of Social Gatherings with Friends and Family: Three times a week    Attends Religious Services: More than 4 times per year    Active Member of Clubs or Organizations: No    Attends Engineer, Structural: Not on file    Marital Status: Married       ROS All review of systems negative except what is listed in the HPI    Objective     BP (!) 113/49 (BP Location: Right Arm, Patient Position: Sitting, Cuff Size: Normal)   Pulse 74   Temp (!) 97.5 F (36.4 C) (Oral)   Ht 5' 2.5 (1.588 m)   Wt 138 lb (62.6 kg) Comment: patient refused to weigh but gave this weight  SpO2 100%   BMI 24.84 kg/m   Physical Exam Vitals reviewed.  Constitutional:      Appearance: Normal appearance.  Cardiovascular:     Rate and Rhythm: Normal rate and regular rhythm.     Heart sounds: Normal heart sounds.  Pulmonary:     Effort: Pulmonary effort is normal.     Breath sounds: Normal breath sounds.  Skin:    General: Skin is warm and dry.  Neurological:     Mental Status: She is alert and oriented to person, place, and time.  Psychiatric:        Mood and Affect: Mood normal.        Behavior: Behavior normal.        Thought Content: Thought content normal.        Judgment: Judgment normal.            Assessment & Plan:     Problem List Items Addressed This Visit       Active Problems   Essential hypertension - Primary   Blood pressure is at goal for age and co-morbidities.   Recommendations: triamterene -hctz 37.5-25 mg daily - BP goal <130/80 -  monitor and log blood pressures at home - check around the same time each day in a relaxed setting - Limit salt to <2000 mg/day - Follow DASH eating plan (heart healthy diet) - limit alcohol to 2 standard drinks per day for men and 1 per day for women - avoid tobacco products - get at least 2 hours of regular aerobic exercise weekly Patient aware of signs/symptoms requiring further/urgent evaluation. Labs updated today.       Relevant Medications   triamterene -hydrochlorothiazide (MAXZIDE-25) 37.5-25 MG tablet   Other Relevant Orders   CBC with Differential/Platelet   Comprehensive metabolic panel with GFR   Vitamin D  deficiency   Currently taking 1,000 units of Vitamin D  a few times per week      Relevant Orders   VITAMIN D  25 Hydroxy (Vit-D Deficiency, Fractures)   Hypothyroidism   Previously well controlled Continue Synthroid  at current dose - 88 mcg daily Recheck TSH and adjust Synthroid  as indicated       Relevant Medications   levothyroxine  (SYNTHROID ) 88 MCG tablet   Other Relevant Orders   TSH   Hyperlipidemia   Medication management: none, lifestyle measures Lifestyle factors for lowering cholesterol include: Diet therapy - heart-healthy diet rich in fruits, veggies, fiber-rich whole grains, lean meats, chicken, fish (at least twice a week), fat-free or 1% dairy products; foods low in saturated/trans fats, cholesterol, sodium, and sugar. Mediterranean diet has shown to be very heart healthy. Regular exercise - recommend at least 30 minutes a day, 5 times per week Weight management  Repeat CMP and lipid panel today       Relevant Medications   triamterene -hydrochlorothiazide (MAXZIDE-25) 37.5-25 MG tablet    Other Relevant Orders   Lipid panel   Other Visit Diagnoses       Encounter for medical examination to establish care               Return in about 7 months (around 03/06/2025) for chronic disease management.  Waddell KATHEE Mon, NP  I,Emily Lagle,acting as a scribe for Waddell KATHEE Mon, NP.,have documented all relevant documentation on the behalf of Waddell KATHEE Mon, NP.  I, Waddell KATHEE Mon, NP, have reviewed all documentation for this visit. The documentation on 08/06/2024 for the exam, diagnosis, procedures, and orders are all accurate and complete.

## 2024-08-06 NOTE — Assessment & Plan Note (Signed)
Medication management: none, lifestyle measures  Lifestyle factors for lowering cholesterol include: Diet therapy - heart-healthy diet rich in fruits, veggies, fiber-rich whole grains, lean meats, chicken, fish (at least twice a week), fat-free or 1% dairy products; foods low in saturated/trans fats, cholesterol, sodium, and sugar. Mediterranean diet has shown to be very heart healthy. Regular exercise - recommend at least 30 minutes a day, 5 times per week Weight management  Repeat CMP and lipid panel today

## 2024-08-06 NOTE — Assessment & Plan Note (Signed)
 Currently taking 1,000 units of Vitamin D  a few times per week

## 2024-08-08 ENCOUNTER — Ambulatory Visit: Payer: Self-pay | Admitting: Family Medicine

## 2024-08-08 DIAGNOSIS — R7989 Other specified abnormal findings of blood chemistry: Secondary | ICD-10-CM

## 2024-09-09 ENCOUNTER — Other Ambulatory Visit

## 2024-09-09 DIAGNOSIS — R7989 Other specified abnormal findings of blood chemistry: Secondary | ICD-10-CM | POA: Diagnosis not present

## 2024-09-09 LAB — CBC WITH DIFFERENTIAL/PLATELET
Basophils Absolute: 0 K/uL (ref 0.0–0.1)
Basophils Relative: 1.2 % (ref 0.0–3.0)
Eosinophils Absolute: 0.1 K/uL (ref 0.0–0.7)
Eosinophils Relative: 3.4 % (ref 0.0–5.0)
HCT: 38.7 % (ref 36.0–46.0)
Hemoglobin: 13.3 g/dL (ref 12.0–15.0)
Lymphocytes Relative: 20.8 % (ref 12.0–46.0)
Lymphs Abs: 0.7 K/uL (ref 0.7–4.0)
MCHC: 34.4 g/dL (ref 30.0–36.0)
MCV: 91 fl (ref 78.0–100.0)
Monocytes Absolute: 0.4 K/uL (ref 0.1–1.0)
Monocytes Relative: 11.4 % (ref 3.0–12.0)
Neutro Abs: 2.1 K/uL (ref 1.4–7.7)
Neutrophils Relative %: 63.2 % (ref 43.0–77.0)
Platelets: 260 K/uL (ref 150.0–400.0)
RBC: 4.25 Mil/uL (ref 3.87–5.11)
RDW: 12.7 % (ref 11.5–15.5)
WBC: 3.3 K/uL — ABNORMAL LOW (ref 4.0–10.5)

## 2024-09-10 ENCOUNTER — Ambulatory Visit: Payer: Self-pay | Admitting: Family Medicine

## 2024-09-27 ENCOUNTER — Telehealth: Payer: Self-pay | Admitting: Neurology

## 2024-09-27 DIAGNOSIS — Z1231 Encounter for screening mammogram for malignant neoplasm of breast: Secondary | ICD-10-CM

## 2024-09-27 NOTE — Telephone Encounter (Signed)
 Mammogram order placed.   Copied from CRM #[id number]. Topic: Clinical - Request for Lab/Test Order >> Sep 27, 2024  2:11 PM Chasity T wrote: Reason for CRM: Patient has called to get an order for a mammogram to be done.

## 2024-10-29 ENCOUNTER — Ambulatory Visit (HOSPITAL_BASED_OUTPATIENT_CLINIC_OR_DEPARTMENT_OTHER)

## 2024-10-29 ENCOUNTER — Ambulatory Visit: Payer: Medicare Other
